# Patient Record
Sex: Male | Born: 1960
Health system: Southern US, Community
[De-identification: ages and names within clinical notes are randomized; demographics above are authoritative.]

## PROBLEM LIST (undated history)

## (undated) DIAGNOSIS — E785 Hyperlipidemia, unspecified: Secondary | ICD-10-CM

## (undated) DIAGNOSIS — K219 Gastro-esophageal reflux disease without esophagitis: Secondary | ICD-10-CM

## (undated) DIAGNOSIS — T7840XA Allergy, unspecified, initial encounter: Secondary | ICD-10-CM

## (undated) DIAGNOSIS — I10 Essential (primary) hypertension: Secondary | ICD-10-CM

## (undated) DIAGNOSIS — M109 Gout, unspecified: Secondary | ICD-10-CM

## (undated) HISTORY — DX: Hyperlipidemia, unspecified: E78.5

## (undated) HISTORY — DX: Gout, unspecified: M10.9

## (undated) HISTORY — DX: Gastro-esophageal reflux disease without esophagitis: K21.9

## (undated) HISTORY — DX: Essential (primary) hypertension: I10

## (undated) HISTORY — DX: Allergy, unspecified, initial encounter: T78.40XA

---

## 2002-02-14 ENCOUNTER — Emergency Department (HOSPITAL_COMMUNITY): Admission: EM | Admit: 2002-02-14 | Discharge: 2002-02-14 | Payer: Self-pay | Admitting: Emergency Medicine

## 2002-02-14 ENCOUNTER — Encounter: Payer: Self-pay | Admitting: Emergency Medicine

## 2004-11-11 ENCOUNTER — Encounter: Admission: RE | Admit: 2004-11-11 | Discharge: 2004-11-11 | Payer: Self-pay | Admitting: Internal Medicine

## 2004-11-14 ENCOUNTER — Encounter: Admission: RE | Admit: 2004-11-14 | Discharge: 2004-11-14 | Payer: Self-pay | Admitting: Internal Medicine

## 2007-02-09 ENCOUNTER — Ambulatory Visit (HOSPITAL_COMMUNITY): Admission: RE | Admit: 2007-02-09 | Discharge: 2007-02-09 | Payer: Self-pay | Admitting: Family Medicine

## 2008-01-04 ENCOUNTER — Emergency Department (HOSPITAL_COMMUNITY): Admission: EM | Admit: 2008-01-04 | Discharge: 2008-01-05 | Payer: Self-pay | Admitting: Emergency Medicine

## 2009-11-03 ENCOUNTER — Emergency Department (HOSPITAL_BASED_OUTPATIENT_CLINIC_OR_DEPARTMENT_OTHER): Admission: EM | Admit: 2009-11-03 | Discharge: 2009-11-03 | Payer: Self-pay | Admitting: Emergency Medicine

## 2010-08-12 LAB — COMPREHENSIVE METABOLIC PANEL
ALT: 42 U/L (ref 0–53)
AST: 28 U/L (ref 0–37)
Albumin: 4 g/dL (ref 3.5–5.2)
Alkaline Phosphatase: 39 U/L (ref 39–117)
BUN: 27 mg/dL — ABNORMAL HIGH (ref 6–23)
CO2: 29 mEq/L (ref 19–32)
Calcium: 8.4 mg/dL (ref 8.4–10.5)
Chloride: 98 mEq/L (ref 96–112)
Creatinine, Ser: 1.1 mg/dL (ref 0.4–1.5)
GFR calc Af Amer: >60 mL/min (ref >60)
GFR calc non Af Amer: >60 mL/min (ref >60)
Glucose, Bld: 108 mg/dL — ABNORMAL HIGH (ref 70–99)
Potassium: 4.4 mEq/L (ref 3.5–5.1)
Sodium: 140 mEq/L (ref 135–145)
Total Bilirubin: 1 mg/dL (ref 0.3–1.2)
Total Protein: 7.3 g/dL (ref 6.0–8.3)

## 2010-08-12 LAB — URINALYSIS, ROUTINE W REFLEX MICROSCOPIC
Bilirubin Urine: NEGATIVE
Glucose, UA: NEGATIVE mg/dL
Hgb urine dipstick: NEGATIVE
Ketones, ur: NEGATIVE mg/dL
Nitrite: NEGATIVE
Protein, ur: NEGATIVE mg/dL
Specific Gravity, Urine: 1.011 (ref 1.005–1.030)
Urobilinogen, UA: 0.2 mg/dL (ref 0.0–1.0)
pH: 6 (ref 5.0–8.0)

## 2010-08-12 LAB — DIFFERENTIAL
Basophils Absolute: 0 10*3/uL (ref 0.0–0.1)
Basophils Relative: 0 % (ref 0–1)
Eosinophils Absolute: 0 10*3/uL (ref 0.0–0.7)
Eosinophils Relative: 0 % (ref 0–5)
Lymphocytes Relative: 7 % — ABNORMAL LOW (ref 12–46)
Lymphs Abs: 0.5 10*3/uL — ABNORMAL LOW (ref 0.7–4.0)
Monocytes Absolute: 0.7 10*3/uL (ref 0.1–1.0)
Monocytes Relative: 10 % (ref 3–12)
Neutro Abs: 5.6 10*3/uL (ref 1.7–7.7)
Neutrophils Relative %: 83 % — ABNORMAL HIGH (ref 43–77)

## 2010-08-12 LAB — CBC
HCT: 42.3 % (ref 39.0–52.0)
Hemoglobin: 14.5 g/dL (ref 13.0–17.0)
MCHC: 34.2 g/dL (ref 30.0–36.0)
MCV: 87.8 fL (ref 78.0–100.0)
Platelets: 193 10*3/uL (ref 150–400)
RBC: 4.82 MIL/uL (ref 4.22–5.81)
RDW: 11.8 % (ref 11.5–15.5)
WBC: 6.8 10*3/uL (ref 4.0–10.5)

## 2011-02-21 LAB — URINALYSIS, ROUTINE W REFLEX MICROSCOPIC
Bilirubin Urine: NEGATIVE
Ketones, ur: NEGATIVE
Nitrite: NEGATIVE
Protein, ur: NEGATIVE

## 2011-02-21 LAB — COMPREHENSIVE METABOLIC PANEL
Albumin: 4.3
Alkaline Phosphatase: 32 — ABNORMAL LOW
BUN: 16
Calcium: 11.2 — ABNORMAL HIGH
Glucose, Bld: 100 — ABNORMAL HIGH
Potassium: 3.7
Sodium: 140
Total Protein: 6.9

## 2011-02-21 LAB — DIFFERENTIAL
Basophils Relative: 0
Lymphs Abs: 1.6
Monocytes Absolute: 0.6
Monocytes Relative: 7
Neutro Abs: 5.5
Neutrophils Relative %: 71

## 2011-02-21 LAB — CBC
HCT: 43.1
MCHC: 34.5
Platelets: 243
RDW: 13

## 2011-12-16 ENCOUNTER — Ambulatory Visit: Payer: Self-pay | Admitting: Internal Medicine

## 2011-12-16 ENCOUNTER — Ambulatory Visit: Payer: Self-pay

## 2011-12-16 VITALS — BP 150/92 | HR 88 | Temp 98.2°F | Resp 18 | Ht 66.75 in | Wt 194.4 lb

## 2011-12-16 DIAGNOSIS — I10 Essential (primary) hypertension: Secondary | ICD-10-CM

## 2011-12-16 DIAGNOSIS — M109 Gout, unspecified: Secondary | ICD-10-CM

## 2011-12-16 DIAGNOSIS — M79673 Pain in unspecified foot: Secondary | ICD-10-CM

## 2011-12-16 DIAGNOSIS — M79609 Pain in unspecified limb: Secondary | ICD-10-CM

## 2011-12-16 LAB — POCT CBC
Lymph, poc: 2 (ref 0.6–3.4)
MCH, POC: 29.8 pg (ref 27–31.2)
MCHC: 32.4 g/dL (ref 31.8–35.4)
MID (cbc): 0.6 (ref 0–0.9)
MPV: 8.4 fL (ref 0–99.8)
POC Granulocyte: 4.9 (ref 2–6.9)
POC LYMPH PERCENT: 26.3 %L (ref 10–50)
POC MID %: 8.4 %M (ref 0–12)
Platelet Count, POC: 357 10*3/uL (ref 142–424)
RDW, POC: 13.1 %
WBC: 7.5 10*3/uL (ref 4.6–10.2)

## 2011-12-16 LAB — URIC ACID: Uric Acid, Serum: 5.7 mg/dL (ref 4.0–7.8)

## 2011-12-16 MED ORDER — COLCHICINE 0.6 MG PO TABS: 0.6000 mg | ORAL_TABLET | ORAL | Status: DC | PRN

## 2011-12-16 MED ORDER — PREDNISONE 10 MG PO TABS: ORAL_TABLET | ORAL | Status: DC

## 2011-12-16 MED ORDER — TRAMADOL HCL 50 MG PO TABS: 50.0000 mg | ORAL_TABLET | Freq: Three times a day (TID) | ORAL | Status: AC | PRN

## 2011-12-16 NOTE — Progress Notes (Signed)
  Subjective:    Patient ID: Aaron Cantu, male    DOB: Oct 19, 1960, 51 y.o.   MRN: [id number]  HPI Has gout, went 2 yrs no attack, has now had left foot attack for 1-2 mos. Now right foot is starting to hurt. He stopped allopurinol 1-2 yrs ago. No fever Nsaids ar hurting gut and flanks.  Review of Systems     Objective:   Physical Exam Left foot swollen, very tender at mtpj, warm   130/92 Right foot not swollen but tender mtpj  Cbc,ua [facility] reading (PRIMARY) by  Dr.Guest.puched out lesions of gout. Results for orders placed in visit on 12/16/11  POCT CBC      Component Value Range   WBC 7.5  4.6 - 10.2 K/uL   Lymph, poc 2.0  0.6 - 3.4   POC LYMPH PERCENT 26.3  10 - 50 %L   MID (cbc) 0.6  0 - 0.9   POC MID % 8.4  0 - 12 %M   POC Granulocyte 4.9  2 - 6.9   Granulocyte percent 65.3  37 - 80 %G   RBC 5.34  4.69 - 6.13 M/uL   Hemoglobin 15.9  14.1 - 18.1 g/dL   HCT, POC 40.9  81.1 - 53.7 %   MCV 92.0  80 - 97 fL   MCH, POC 29.8  27 - 31.2 pg   MCHC 32.4  31.8 - 35.4 g/dL   RDW, POC 91.4     Platelet Count, POC 357  142 - 424 K/uL   MPV 8.4  0 - 99.8 fL             Assessment & Plan:  Colcrys

## 2011-12-16 NOTE — Progress Notes (Signed)
51 year old White male is here today with complaints of Left foot pain for nine to ten days. Pt has no other complaints.

## 2011-12-17 ENCOUNTER — Telehealth: Payer: Self-pay

## 2011-12-17 NOTE — Telephone Encounter (Signed)
Please look over pt rx for pretizone his instructions say pt should have more pills, they were only given 21 [phone]

## 2011-12-18 NOTE — Telephone Encounter (Signed)
Patient should have been prescribed 39 instead of 21. Labs need sign off.

## 2011-12-18 NOTE — Telephone Encounter (Signed)
Pt is out of predisone should have been given more than received [facility] [address]  Also wants the uric acids test results  Call [phone]

## 2011-12-19 ENCOUNTER — Telehealth: Payer: Self-pay

## 2011-12-19 DIAGNOSIS — M79673 Pain in unspecified foot: Secondary | ICD-10-CM

## 2011-12-19 DIAGNOSIS — I10 Essential (primary) hypertension: Secondary | ICD-10-CM

## 2011-12-19 DIAGNOSIS — M109 Gout, unspecified: Secondary | ICD-10-CM

## 2011-12-19 NOTE — Telephone Encounter (Signed)
1- please call [facility] I am pretty sure that I faxed an authorization for the correct number of pills yesterday.  If not please correct the number from #21 to #39. 2- Uric acid was NL - but that can sometimes happen during an acute attack.

## 2011-12-19 NOTE — Telephone Encounter (Signed)
[name] STATES HER HUSBAND WAS ONLY GIVEN 21 PREDNISONE PILLS AND IT SHOULD HAVE BEEN 39 SINCE HIS DOSAGE IS HIGHER. PLEASE CALL [phone]  [facility] [address]

## 2011-12-20 MED ORDER — PREDNISONE 10 MG PO TABS: ORAL_TABLET | ORAL | Status: DC

## 2011-12-20 NOTE — Telephone Encounter (Signed)
Rx sent to pharmacy   

## 2011-12-21 NOTE — Telephone Encounter (Signed)
LMOM THAT RX SHOULD HAVE BEEN SENT IN

## 2012-07-09 ENCOUNTER — Telehealth: Payer: Self-pay

## 2012-07-10 NOTE — Telephone Encounter (Signed)
She must ask her husband to come in for a PE and we can address the memory concerns then

## 2012-07-12 NOTE — Telephone Encounter (Signed)
Wife has concerns about patients memory, I have called her back to advise he needs a physical. Left message for her to call me back.

## 2012-07-12 NOTE — Telephone Encounter (Signed)
I have spoken to her to advise. She is concerned he will not make appt.

## 2012-08-02 ENCOUNTER — Telehealth: Payer: Self-pay

## 2012-08-02 MED ORDER — LISINOPRIL 10 MG PO TABS: 10.0000 mg | ORAL_TABLET | Freq: Every day | ORAL | Status: DC

## 2012-08-02 NOTE — Telephone Encounter (Signed)
Sent in for him, called to advise.

## 2012-08-02 NOTE — Telephone Encounter (Signed)
Pt does not have insurance until April 1, he is out of lisinopril (PRINIVIL,ZESTRIL) 10 MG tablet

## 2012-08-02 NOTE — Telephone Encounter (Signed)
[facility] [address]   [phone]

## 2012-08-13 ENCOUNTER — Telehealth: Payer: Self-pay

## 2012-08-13 ENCOUNTER — Other Ambulatory Visit: Payer: Self-pay | Admitting: Internal Medicine

## 2012-08-13 NOTE — Telephone Encounter (Signed)
Colchicine sent in for him today. Called him to advise, he will need office visit for further refills.

## 2012-08-13 NOTE — Telephone Encounter (Signed)
Pt's wife is calling regarding pts gout flare up, pt is unable to come in until April because he will have insurance during this time. Best# [phone]

## 2012-09-04 ENCOUNTER — Ambulatory Visit (INDEPENDENT_AMBULATORY_CARE_PROVIDER_SITE_OTHER): Payer: BC Managed Care – PPO | Admitting: Emergency Medicine

## 2012-09-04 VITALS — BP 170/100 | HR 72 | Temp 98.8°F | Resp 16 | Ht 66.0 in | Wt 203.0 lb

## 2012-09-04 DIAGNOSIS — I10 Essential (primary) hypertension: Secondary | ICD-10-CM

## 2012-09-04 DIAGNOSIS — K219 Gastro-esophageal reflux disease without esophagitis: Secondary | ICD-10-CM

## 2012-09-04 MED ORDER — LISINOPRIL 20 MG PO TABS: 20.0000 mg | ORAL_TABLET | Freq: Every day | ORAL | Status: DC

## 2012-09-04 MED ORDER — OMEPRAZOLE 40 MG PO CPDR: 40.0000 mg | DELAYED_RELEASE_CAPSULE | Freq: Every day | ORAL | Status: DC

## 2012-09-04 NOTE — Progress Notes (Signed)
  Subjective:    Patient ID: Aaron Cantu, male    DOB: 12/26/1960, 52 y.o.   MRN: [id number]  HPI patient presents for refill of his blood pressure medications. He's been out of his blood pressure medicines for 3 days now. He has not been exercising he has been gaining weight. He has a 53-year-old cleansed at home and these have been taking up a good deal of his time. He has not had a physical exam in a number of years    Review of Systems     Objective:   Physical Exam HEENT exam is unremarkable. Neck is supple. Chest is clear to auscultation and percussion. Heart regular rate no murmurs. Extremities are without edema .  EKG normal sinus rhythm      Assessment & Plan:  I told the patient he needs to schedule complete physical. He is nonfasting today so no blood work was done. I did go ahead with an EKG today. I increased his lisinopril to 20 mg a day. He will continue Prilosec at 40 mg daily as needed .

## 2012-09-04 NOTE — Patient Instructions (Addendum)

## 2012-09-18 ENCOUNTER — Telehealth: Payer: Self-pay

## 2012-11-10 ENCOUNTER — Ambulatory Visit (INDEPENDENT_AMBULATORY_CARE_PROVIDER_SITE_OTHER): Payer: BC Managed Care – PPO | Admitting: Emergency Medicine

## 2012-11-10 VITALS — BP 120/82 | HR 68 | Temp 97.3°F | Resp 18 | Ht 67.0 in | Wt 202.0 lb

## 2012-11-10 DIAGNOSIS — R079 Chest pain, unspecified: Secondary | ICD-10-CM

## 2012-11-10 DIAGNOSIS — S239XXA Sprain of unspecified parts of thorax, initial encounter: Secondary | ICD-10-CM

## 2012-11-10 MED ORDER — NAPROXEN SODIUM 550 MG PO TABS: 550.0000 mg | ORAL_TABLET | Freq: Two times a day (BID) | ORAL | Status: DC

## 2012-11-10 MED ORDER — CYCLOBENZAPRINE HCL 10 MG PO TABS: 10.0000 mg | ORAL_TABLET | Freq: Three times a day (TID) | ORAL | Status: DC | PRN

## 2012-11-10 NOTE — Progress Notes (Signed)
[facility] [address] [place] [address] [phone]  Date:  11/10/2012   Name:  Aaron Cantu   DOB:  [birth year]   MRN:  [id number]  PCP:  No primary provider on file.    Chief Complaint: Back Pain   History of Present Illness:  Aaron Cantu is a 52 y.o. very pleasant male patient who presents with the following:  Bent over to make the bed and developed a pain in the back between the shoulder blades that has persisted throughout the day.  Also had transient pain in the left anterior chest that was sharp in nature and mostly resolved after a minute. Now has some residual discomfort.  No shortness of breath, wheezing, cough.  Has nausea and no vomiting.  Associated with diarrhea 4-5 times today.  Pain in chest and back increase with deep breathing and movement.  Was doing the breast stroke in the pool with his daughter on his back yesterday.  No improvement with over the counter medications or other home remedies. Denies other complaint or health concern today.   Patient Active Problem List   Diagnosis Date Noted  . Foot pain 12/16/2011  . Gout 12/16/2011  . HTN (hypertension) 12/16/2011    Past Medical History  Diagnosis Date  . Hypertension   . Gout   . GERD (gastroesophageal reflux disease)   . Allergy     History reviewed. No pertinent past surgical history.  History  Substance Use Topics  . Smoking status: Never Smoker   . Smokeless tobacco: Not on file  . Alcohol Use: Yes     Comment: socially    Family History  Problem Relation Age of Onset  . Clotting disorder Mother   . Multiple sclerosis Father   . Brain cancer Brother     Allergies  Allergen Reactions  . Darvon (Propoxyphene Hcl) Nausea Only    Medication list has been reviewed and updated.  Current Outpatient Prescriptions on File Prior to Visit  Medication Sig Dispense Refill  . COLCRYS 0.6 MG tablet TAKE ONE TABLET BY MOUTH AS NEEDED  30 tablet  0  . lisinopril  (PRINIVIL,ZESTRIL) 20 MG tablet Take 1 tablet (20 mg total) by mouth daily.  30 tablet  5  . omeprazole (PRILOSEC) 40 MG capsule Take 1 capsule (40 mg total) by mouth daily.  30 capsule  5   No current facility-administered medications on file prior to visit.    Review of Systems:  As per HPI, otherwise negative.    Physical Examination: Filed Vitals:   11/10/12 1505  BP: 120/82  Pulse: 68  Temp: 97.3 F (36.3 C)  Resp: 18   Filed Vitals:   11/10/12 1505  Height: 5\' 7"  (1.702 m)  Weight: 202 lb (91.627 kg)   Body mass index is 31.63 kg/(m^2). Ideal Body Weight: Weight in (lb) to have BMI = 25: 159.3  GEN: WDWN, NAD, Non-toxic, A & O x 3 HEENT: Atraumatic, Normocephalic. Neck supple. No masses, No LAD. Ears and Nose: No external deformity. CV: RRR, No M/G/R. No JVD. No thrill. No extra heart sounds. PULM: CTA B, no wheezes, crackles, rhonchi. No retractions. No resp. distress. No accessory muscle use. ABD: S, NT, ND, +BS. No rebound. No HSM. EXTR: No c/c/e NEURO Normal gait.  PSYCH: Normally interactive. Conversant. Not depressed or anxious appearing.  Calm demeanor.  BACK:  Tender mid right chest wall posteriorly  Assessment and Plan: Muscle strain Anaprox Flexeril   Signed,  Ellison Carwin, MD

## 2012-11-10 NOTE — Patient Instructions (Addendum)
Back Pain, Adult  Low back pain is very common. About 1 in 5 people have back pain. The cause of low back pain is rarely dangerous. The pain often gets better over time. About half of people with a sudden onset of back pain feel better in just 2 weeks. About 8 in 10 people feel better by 6 weeks.   CAUSES  Some common causes of back pain include:  · Strain of the muscles or ligaments supporting the spine.  · Wear and tear (degeneration) of the spinal discs.  · Arthritis.  · Direct injury to the back.  DIAGNOSIS  Most of the time, the direct cause of low back pain is not known. However, back pain can be treated effectively even when the exact cause of the pain is unknown. Answering your caregiver's questions about your overall health and symptoms is one of the most accurate ways to make sure the cause of your pain is not dangerous. If your caregiver needs more information, he or she may order lab work or imaging tests (X-rays or MRIs). However, even if imaging tests show changes in your back, this usually does not require surgery.  HOME CARE INSTRUCTIONS  For many people, back pain returns. Since low back pain is rarely dangerous, it is often a condition that people can learn to manage on their own.   · Remain active. It is stressful on the back to sit or stand in one place. Do not sit, drive, or stand in one place for more than 30 minutes at a time. Take short walks on level surfaces as soon as pain allows. Try to increase the length of time you walk each day.  · Do not stay in bed. Resting more than 1 or 2 days can delay your recovery.  · Do not avoid exercise or work. Your body is made to move. It is not dangerous to be active, even though your back may hurt. Your back will likely heal faster if you return to being active before your pain is gone.  · Pay attention to your body when you  bend and lift. Many people have less discomfort when lifting if they bend their knees, keep the load close to their bodies, and  avoid twisting. Often, the most comfortable positions are those that put less stress on your recovering back.  · Find a comfortable position to sleep. Use a firm mattress and lie on your side with your knees slightly bent. If you lie on your back, put a pillow under your knees.  · Only take over-the-counter or prescription medicines as directed by your caregiver. Over-the-counter medicines to reduce pain and inflammation are often the most helpful. Your caregiver may prescribe muscle relaxant drugs. These medicines help dull your pain so you can more quickly return to your normal activities and healthy exercise.  · Put ice on the injured area.  · Put ice in a plastic bag.  · Place a towel between your skin and the bag.  · Leave the ice on for 15-20 minutes, 3-4 times a day for the first 2 to 3 days. After that, ice and heat may be alternated to reduce pain and spasms.  · Ask your caregiver about trying back exercises and gentle massage. This may be of some benefit.  · Avoid feeling anxious or stressed. Stress increases muscle tension and can worsen back pain. It is important to recognize when you are anxious or stressed and learn ways to manage it. Exercise is a great option.  SEEK MEDICAL CARE IF:  · You have pain that is not relieved with rest or   medicine.  · You have pain that does not improve in 1 week.  · You have new symptoms.  · You are generally not feeling well.  SEEK IMMEDIATE MEDICAL CARE IF:   · You have pain that radiates from your back into your legs.  · You develop new bowel or bladder control problems.  · You have unusual weakness or numbness in your arms or legs.  · You develop nausea or vomiting.  · You develop abdominal pain.  · You feel faint.  Document Released: 05/12/2005 Document Revised: 11/11/2011 Document Reviewed: 09/30/2010  [employer]® Patient Information ©2014 [employer].

## 2013-01-17 ENCOUNTER — Other Ambulatory Visit: Payer: Self-pay

## 2013-01-17 MED ORDER — OMEPRAZOLE 40 MG PO CPDR: 40.0000 mg | DELAYED_RELEASE_CAPSULE | Freq: Every day | ORAL | Status: DC

## 2013-01-31 ENCOUNTER — Other Ambulatory Visit: Payer: Self-pay

## 2013-01-31 MED ORDER — LISINOPRIL 20 MG PO TABS: 20.0000 mg | ORAL_TABLET | Freq: Every day | ORAL | Status: DC

## 2013-04-01 ENCOUNTER — Other Ambulatory Visit: Payer: Self-pay | Admitting: Emergency Medicine

## 2013-04-20 ENCOUNTER — Other Ambulatory Visit: Payer: Self-pay | Admitting: Internal Medicine

## 2013-04-28 ENCOUNTER — Telehealth: Payer: Self-pay | Admitting: Radiology

## 2013-04-28 MED ORDER — LANSOPRAZOLE 30 MG PO CPDR: 30.0000 mg | DELAYED_RELEASE_CAPSULE | Freq: Every day | ORAL | Status: DC

## 2013-04-28 NOTE — Telephone Encounter (Signed)
Called and spoke with patient. States that he hasn't been having any trouble getting omeprazole and has been taking it for a period of time now.

## 2013-04-28 NOTE — Telephone Encounter (Signed)
Sent prevacid to pharmacy.  If pt is having trouble with ins coverage, he needs to call the ins company and find out their preferred drug

## 2013-04-28 NOTE — Telephone Encounter (Signed)
Omeprazole not preferred drug with [employer] appears prevacid may be on formulary

## 2013-04-29 ENCOUNTER — Telehealth: Payer: Self-pay

## 2013-04-29 NOTE — Telephone Encounter (Signed)
Patient is going to call his insurance company to find out why they are no longer covering Prilosec. Pt will call us back if he needs any further assistance/

## 2013-04-29 NOTE — Telephone Encounter (Signed)
Patient is calling to say that he went to pick up the rx that we sent to the pharmacy and it was the wrong thing and twice the cost please call patient with questions

## 2013-05-06 ENCOUNTER — Other Ambulatory Visit: Payer: Self-pay | Admitting: Emergency Medicine

## 2013-05-06 NOTE — Telephone Encounter (Signed)
Spoke to pt. Sent in a 30 day supply. Advised him Dr. Cleta Alberts will be in the office Wed 05/11/13 Pt states he will RTC the morning.

## 2013-05-11 ENCOUNTER — Ambulatory Visit: Payer: BC Managed Care – PPO

## 2013-05-11 ENCOUNTER — Ambulatory Visit (INDEPENDENT_AMBULATORY_CARE_PROVIDER_SITE_OTHER): Payer: BC Managed Care – PPO | Admitting: Family Medicine

## 2013-05-11 VITALS — BP 122/74 | HR 74 | Temp 97.8°F | Resp 18 | Ht 66.5 in | Wt 199.0 lb

## 2013-05-11 DIAGNOSIS — I10 Essential (primary) hypertension: Secondary | ICD-10-CM

## 2013-05-11 DIAGNOSIS — Z23 Encounter for immunization: Secondary | ICD-10-CM

## 2013-05-11 DIAGNOSIS — M542 Cervicalgia: Secondary | ICD-10-CM

## 2013-05-11 DIAGNOSIS — Z0000 Encounter for general adult medical examination without abnormal findings: Secondary | ICD-10-CM

## 2013-05-11 LAB — POCT CBC
HCT, POC: 46.7 % (ref 43.5–53.7)
Hemoglobin: 15.1 g/dL (ref 14.1–18.1)
MCH, POC: 29.7 pg (ref 27–31.2)
MCV: 91.9 fL (ref 80–97)
MPV: 8.6 fL (ref 0–99.8)
POC Granulocyte: 3.1 (ref 2–6.9)
POC MID %: 6.2 %M (ref 0–12)
RBC: 5.08 M/uL (ref 4.69–6.13)
RDW, POC: 13.7 %
WBC: 5 10*3/uL (ref 4.6–10.2)

## 2013-05-11 LAB — COMPREHENSIVE METABOLIC PANEL
ALT: 29 U/L (ref 0–53)
Alkaline Phosphatase: 45 U/L (ref 39–117)
CO2: 28 mEq/L (ref 19–32)
Calcium: 9.8 mg/dL (ref 8.4–10.5)
Chloride: 101 mEq/L (ref 96–112)
Creat: 1.01 mg/dL (ref 0.50–1.35)
Glucose, Bld: 97 mg/dL (ref 70–99)
Total Bilirubin: 0.6 mg/dL (ref 0.3–1.2)

## 2013-05-11 LAB — LIPID PANEL
Cholesterol: 267 mg/dL — ABNORMAL HIGH (ref 0–200)
Total CHOL/HDL Ratio: 4.9 Ratio
VLDL: 32 mg/dL (ref 0–40)

## 2013-05-11 LAB — PSA: PSA: 1.51 ng/mL (ref <=4.00)

## 2013-05-11 MED ORDER — CYCLOBENZAPRINE HCL 10 MG PO TABS: 10.0000 mg | ORAL_TABLET | Freq: Three times a day (TID) | ORAL | Status: DC | PRN

## 2013-05-11 MED ORDER — LISINOPRIL 20 MG PO TABS: ORAL_TABLET | ORAL | Status: DC

## 2013-05-11 NOTE — Patient Instructions (Signed)
Try taking flexeril at night for your neck and try ibuprofen during the day.    I will be in touch regarding your labs Please schedule a screening colonoscopy at your convenience with Caryn Section or [facility]

## 2013-05-11 NOTE — Progress Notes (Signed)
[facility] [address], [place] [address] [phone]  Date:  05/11/2013   Name:  Aaron Cantu   DOB:  Feb 12, 1961   MRN:  [id number]  PCP:  No primary provider on file.    Chief Complaint: Annual Exam, Neck Pain and Leg Pain   History of Present Illness:  Aaron Cantu is a 52 y.o. very pleasant male patient who presents with the following:  He needs a CPE.  He also has a few other concerns today.   He has twins- they are nearly 43 years old. His daughter jumped on his back when he was prone on the ground.  It "knocked the wind out of me."  This occurred one week ago.   Since then he has noted some pain in his neck if he flexed his neck for a longer period such as if talking on the phone.  No numbness or pain down his arms.   He has not tried his flexeril as it makes him sleepy.  Ibuprofen does help.  He also notes that he strained his left groin when he moved a piece of furniture about one year ago.  Since then he may notice some soreness in this area in the morning on some days.  This is better after he has a BM.  He has not noted a bulge.  This does not bother him a lot, but he wanted to have it looked at.    He is fasting today. Married, non- smoker.  Admits he does not get as much exercise as he should.   He has not yet had a colonoscopy Unsure date of last tetanus He has a history of gout and uses colcrys PRN- however he has not had pain in about one year.    He is interested in getting a shingles vaccine.   Patient Active Problem List   Diagnosis Date Noted  . Foot pain 12/16/2011  . Gout 12/16/2011  . HTN (hypertension) 12/16/2011    Past Medical History  Diagnosis Date  . Hypertension   . Gout   . GERD (gastroesophageal reflux disease)   . Allergy     History reviewed. No pertinent past surgical history.  History  Substance Use Topics  . Smoking status: Never Smoker   . Smokeless tobacco: Not on file  . Alcohol Use: Yes   Comment: socially    Family History  Problem Relation Age of Onset  . Clotting disorder Mother   . Hypertension Mother   . Multiple sclerosis Father   . Brain cancer Brother     Allergies  Allergen Reactions  . Darvon [Propoxyphene Hcl] Nausea Only    Medication list has been reviewed and updated.  Current Outpatient Prescriptions on File Prior to Visit  Medication Sig Dispense Refill  . colchicine (COLCRYS) 0.6 MG tablet Take 1 tablet (0.6 mg total) by mouth as needed. PATIENT NEEDS OFFICE VISIT FOR ADDITIONAL REFILLS - 2nd NOTICE  15 tablet  0  . lisinopril (PRINIVIL,ZESTRIL) 20 MG tablet TAKE ONE TABLET BY MOUTH ONCE DAILY.  30 tablet  0  . cyclobenzaprine (FLEXERIL) 10 MG tablet Take 1 tablet (10 mg total) by mouth 3 (three) times daily as needed for muscle spasms.  30 tablet  0  . lansoprazole (PREVACID) 30 MG capsule Take 1 capsule (30 mg total) by mouth daily at 12 noon.  30 capsule  5  . naproxen sodium (ANAPROX DS) 550 MG tablet Take 1 tablet (550 mg  total) by mouth 2 (two) times daily with a meal.  40 tablet  0   No current facility-administered medications on file prior to visit.    Review of Systems:  As per HPI- otherwise negative.   Physical Examination: Filed Vitals:   05/11/13 0913  BP: 122/74  Pulse: 74  Temp: 97.8 F (36.6 C)  Resp: 18   Filed Vitals:   05/11/13 0913  Height: 5' 6.5" (1.689 m)  Weight: 199 lb (90.266 kg)   Body mass index is 31.64 kg/(m^2). Ideal Body Weight: Weight in (lb) to have BMI = 25: 156.9  GEN: WDWN, NAD, Non-toxic, A & O x 3, overweight, looks well HEENT: Atraumatic, Normocephalic. Neck supple. No masses, No LAD.  Bilateral TM wnl, oropharynx normal.  PEERL,EOMI.   Ears and Nose: No external deformity. CV: RRR, No M/G/R. No JVD. No thrill. No extra heart sounds. PULM: CTA B, no wheezes, crackles, rhonchi. No retractions. No resp. distress. No accessory muscle use. ABD: S, NT, ND, +BS. No rebound. No HSM. EXTR: No  c/c/e NEURO Normal gait.  PSYCH: Normally interactive. Conversant. Not depressed or anxious appearing.  Calm demeanor.  GU: no inguinal hernia appreciated.  No testicular masses.   Normal DTR, prostate is normal Normal lumbar flexion and extension, negative SLR bilaterally.  Normal strength, sensation and DTR all extremities.  Neck with normal ROM, no point bony tenderness.   [facility] reading (PRIMARY) by  Dr. Patsy Lager. C spine series; degenerative change, otherwise normal   CERVICAL SPINE 4+ VIEWS  COMPARISON: None.  FINDINGS: There is no fracture or subluxation. No foraminal stenosis. There are osteophytes at the anterior aspects of the C4-5 and C5-6 disc spaces. Slight narrowing of the C5-6 disc space.  Slight facet arthritis at C5-6 on the left C6-7 and C7-T1 on the right.  IMPRESSION: Slight to facet arthritis. Minimal degenerative disc disease at C5-6.   Results for orders placed in visit on 05/11/13  POCT CBC      Result Value Range   WBC 5.0  4.6 - 10.2 K/uL   Lymph, poc 1.6  0.6 - 3.4   POC LYMPH PERCENT 31.9  10 - 50 %L   MID (cbc) 0.3  0 - 0.9   POC MID % 6.2  0 - 12 %M   POC Granulocyte 3.1  2 - 6.9   Granulocyte percent 61.9  37 - 80 %G   RBC 5.08  4.69 - 6.13 M/uL   Hemoglobin 15.1  14.1 - 18.1 g/dL   HCT, POC 40.9  81.1 - 53.7 %   MCV 91.9  80 - 97 fL   MCH, POC 29.7  27 - 31.2 pg   MCHC 32.3  31.8 - 35.4 g/dL   RDW, POC 91.4     Platelet Count, POC 272  142 - 424 K/uL   MPV 8.6  0 - 99.8 fL    Assessment and Plan: Physical exam - Plan: POCT CBC, Comprehensive metabolic panel, Lipid panel, PSA, Tdap vaccine greater than or equal to 7yo IM  HTN (hypertension) - Plan: lisinopril (PRINIVIL,ZESTRIL) 20 MG tablet  Pain in neck - Plan: DG Cervical Spine Complete, cyclobenzaprine (FLEXERIL) 10 MG tablet  Labs as above for CPE, updated tdap and gave rx for shingles vaccine.  He is aware that his insurance may not pay until he turns 59 Encouraged a colonoscopy  and more exercise Suspect his neck hurts due to degenerative change, deconditioning and contusion to his back.  He will  try some flexeril at night only BP controlled, continue lisinopril Pain in groin: suspect a pulled groin and possible beginning hernia.  He admits that this does not bother him enough to even think about surgery at this time.  He will continue to watch it and let us know if anything gets worse  Signed Abbe Amsterdam, MD

## 2013-05-14 ENCOUNTER — Encounter: Payer: Self-pay | Admitting: Family Medicine

## 2013-05-14 NOTE — Telephone Encounter (Signed)
Pt wife called concerning husband memory, She want to know if her husband had an exam for his memory. Please call wife at [phone]

## 2013-05-16 NOTE — Telephone Encounter (Signed)
No this was not discussed in office visit. Called her to advise. He should come in for this, we can do mini mental status testing and refer to neurology if needed. Left message for her to advise.

## 2013-08-11 ENCOUNTER — Other Ambulatory Visit: Payer: Self-pay | Admitting: Physician Assistant

## 2013-08-11 DIAGNOSIS — M109 Gout, unspecified: Secondary | ICD-10-CM

## 2013-08-12 NOTE — Telephone Encounter (Signed)
Dr Patsy Lager[place], you saw pt in Dec, but not for gout. Can we RF this?

## 2013-11-03 ENCOUNTER — Telehealth: Payer: Self-pay

## 2013-11-03 ENCOUNTER — Ambulatory Visit (INDEPENDENT_AMBULATORY_CARE_PROVIDER_SITE_OTHER): Payer: BC Managed Care – PPO | Admitting: Family Medicine

## 2013-11-03 VITALS — BP 146/88 | HR 76 | Temp 98.3°F | Resp 18 | Ht 66.25 in | Wt 205.6 lb

## 2013-11-03 DIAGNOSIS — M25519 Pain in unspecified shoulder: Secondary | ICD-10-CM

## 2013-11-03 DIAGNOSIS — R079 Chest pain, unspecified: Secondary | ICD-10-CM

## 2013-11-03 DIAGNOSIS — M25511 Pain in right shoulder: Secondary | ICD-10-CM

## 2013-11-03 MED ORDER — CYCLOBENZAPRINE HCL 10 MG PO TABS: 10.0000 mg | ORAL_TABLET | Freq: Three times a day (TID) | ORAL | Status: DC | PRN

## 2013-11-03 MED ORDER — MELOXICAM 15 MG PO TABS: 15.0000 mg | ORAL_TABLET | Freq: Every day | ORAL | Status: DC

## 2013-11-03 NOTE — Patient Instructions (Signed)
Your EKG is normal today - I do not think your heart is causing your chest pain  I suspect that your pain is all musculoskeletal.  It is probably related to the yard work you were doing  Take the meloxicam (Mobic) once daily for the next week.  Do not take any additional ibuprofen or Aleve while taking this medicine.  You can take Tylenol if you need additional pain relief  Take the cyclobenzaprine (Flexeril) if needed - this may make you sleepy  Ice the shoulder frequently over the next 48 hours  Don't completely immobilize the shoulder, but do take it easy for the next couple of days  I expect this this will be feeling much better in a few days, though it may take a week or so for you to feel fully back to 100%  If any symptoms are worsening or you develop new symptoms, please come back in   Muscle Strain A muscle strain is an injury that occurs when a muscle is stretched beyond its normal length. Usually a small number of muscle fibers are torn when this happens. Muscle strain is rated in degrees. First-degree strains have the least amount of muscle fiber tearing and pain. Second-degree and third-degree strains have increasingly more tearing and pain.  Usually, recovery from muscle strain takes 1 2 weeks. Complete healing takes 5 6 weeks.  CAUSES  Muscle strain happens when a sudden, violent force placed on a muscle stretches it too far. This may occur with lifting, sports, or a fall.  RISK FACTORS Muscle strain is especially common in athletes.  SIGNS AND SYMPTOMS At the site of the muscle strain, there may be:  Pain.  Bruising.  Swelling.  Difficulty using the muscle due to pain or lack of normal function. DIAGNOSIS  Your health care provider will perform a physical exam and ask about your medical history. TREATMENT  Often, the best treatment for a muscle strain is resting, icing, and applying cold compresses to the injured area.  HOME CARE INSTRUCTIONS   Use the PRICE  method of treatment to promote muscle healing during the first 2 3 days after your injury. The PRICE method involves:  Protecting the muscle from being injured again.  Restricting your activity and resting the injured body part.  Icing your injury. To do this, put ice in a plastic bag. Place a towel between your skin and the bag. Then, apply the ice and leave it on from 15 20 minutes each hour. After the third day, switch to moist heat packs.  Apply compression to the injured area with a splint or elastic bandage. Be careful not to wrap it too tightly. This may interfere with blood circulation or increase swelling.  Elevate the injured body part above the level of your heart as often as you can.  Only take over-the-counter or prescription medicines for pain, discomfort, or fever as directed by your health care provider.  Warming up prior to exercise helps to prevent future muscle strains. SEEK MEDICAL CARE IF:   You have increasing pain or swelling in the injured area.  You have numbness, tingling, or a significant loss of strength in the injured area. MAKE SURE YOU:   Understand these instructions.  Will watch your condition.  Will get help right away if you are not doing well or get worse. Document Released: 05/12/2005 Document Revised: 03/02/2013 Document Reviewed: 12/09/2012 [facility] Patient Information 2014 [place], [place].

## 2013-11-03 NOTE — Telephone Encounter (Addendum)
I was unaware that his wife was asked to leave the room.  She was not in the room by the time I saw him, and he did not mention to me that he wanted her in the room.  He did not report any neurological symptoms, and he appeared neurologically intact at the time of the visit.  He was seen for shoulder/chest pain that developed after working in the yard.  His symptoms are consistent with musculoskeletal pain.  His EKG was normal.  If pt is concerned that he needs a scan of his brain, he needs to RTC so we can discuss.  He indicated to me that he was here for shoulder pain NOT a neurological concern

## 2013-11-03 NOTE — Telephone Encounter (Signed)
How would you like to deal with this? Patient was seen for chest pain, most of this problem seems social, if he is upset because she called.

## 2013-11-03 NOTE — Progress Notes (Signed)
Subjective:    Patient ID: Aaron Cantu, male    DOB: 1960/11/25, 53 y.o.   MRN: [id number]  HPI   Aaron Cantu is a very pleasant 53 yr old male here with complaint of RIGHT shoulder pain that began this morning.  He also has some associated upper chest pain that radiates to the RIGHT.  He reports that pain is worse with movement - specifically flexion and internal rotation.   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  He also has two young children who will sometimes jump on him.  He reports that about 1 week ago he had pain in the LEFT shoulder and LEFT chest but this resolved.  Occasionally he has pain in his RIGHT neck, but thinks this may be due to the way he is sleeping.  He is neck does not hurt currently.  He denies numbness or weakness.  He denies prior issues with the RIGHT shoulder.  He is RIGHT hand dominant.  He has not taken anything for pain.  The CP is in the upper middle chest but radiates to the RIGHT esp when he takes a deep breath and expands the chest.  He denies exertional chest pain. He denies SOB.    He has previously had bursitis of the LEFT shoulder - this shoulder is not bothering him today.  Has previously used Flexeril with good relief.     Review of Systems  Constitutional: Negative for fever and chills.  Respiratory: Negative for cough, shortness of breath and wheezing.   Cardiovascular: Positive for chest pain. Negative for palpitations.  Gastrointestinal: Negative.   Musculoskeletal: Positive for arthralgias and myalgias. Negative for back pain, gait problem and neck pain.  Skin: Negative.   Neurological: Negative for weakness and numbness.       Objective:   Physical Exam  Vitals reviewed. Constitutional: He is oriented to person, place, and time. He appears well-developed and well-nourished. No distress.  HENT:  Head: Normocephalic and atraumatic.  Eyes: Conjunctivae are normal. No scleral icterus.  Cardiovascular: Normal rate,  regular rhythm, normal heart sounds and intact distal pulses.   Pulmonary/Chest: Effort normal and breath sounds normal. He has no wheezes. He has no rales.  Musculoskeletal:       Right shoulder: He exhibits decreased range of motion and tenderness. He exhibits no bony tenderness, no crepitus, no deformity, normal pulse and normal strength.  Tenderness about the RIGHT shoulder, esp at anterior aspect; increased pain with resisted flexion and resisted internal rotation; full passive range; drop arm test negative; empty can test negative; radial pulse 2+; sensation intact  Neurological: He is alert and oriented to person, place, and time.  Skin: Skin is warm and dry.  Psychiatric: He has a normal mood and affect. His behavior is normal.      EKG interpretation with Dr. Alwyn Ren  - NSR     Assessment & Plan:  Right shoulder pain - Plan: cyclobenzaprine (FLEXERIL) 10 MG tablet, meloxicam (MOBIC) 15 MG tablet  Chest pain - Plan: EKG 12-Lead   Aaron Cantu is a very pleasant 53 yr old male here with onset of RIGHT shoulder pain and RIGHT sided chest pain today.  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  Suspect muscle strain/overuse injury.  His chest pain is reproducible on palpation, and I suspect it is a strain of the pectoralis muscle as it radiates across to his right shoulder.  His EKG is normal.  Will  treat with Mobic and Flexeril.  Frequent icing over the next 48 hours.  Relative rest for the RIGHT shoulder.    Pt to call or RTC if worsening or not improving  E. Frances FurbishElizabeth Dacie Mandel MHS, PA-C [facility] 11/03/2013

## 2013-11-03 NOTE — Telephone Encounter (Signed)
Wife is extremely upset 1) she was asked to leave the room this morning while he was in the exam room;  2) Something is wrong with her husband neurology and she insists he has an MRI or scan of his brain.  3) "She will hold [place] liable if something happens."  He will yell and scream at her if he knows she called [place].   Her cell phone is [phone]

## 2013-11-04 NOTE — Telephone Encounter (Signed)
LMVM to CB. 

## 2013-11-05 ENCOUNTER — Telehealth: Payer: Self-pay | Admitting: Family Medicine

## 2013-11-05 NOTE — Progress Notes (Signed)
Discussed patient.  Reviewed the xrays which appear normal.  Agree with plan.  Janace Hoardavid Angla Delahunt MD

## 2013-11-05 NOTE — Telephone Encounter (Signed)
[name] called back again.  Asked me to call Aaron Cantu and tell him that he needs to come in and be seen for his blood pressure and headaches as a way of getting him into the office for "a complete neurological exam and a scan."  Explained that I cannot lie to a patient in this way, but we are certainly glad to address her concerns with Aaron Cantu when he comes in.

## 2013-11-05 NOTE — Telephone Encounter (Signed)
Aaron Cantu's wife Aaron Cantu called today- I spoke with her.  She notes that her husband is getting lost, having a lot of trouble with his memory over the last year or so.  However he does not want to seek care and does not desire to have further evaluation.  He was here a couple of days ago with shoulder pain- Aaron Cantu saw him and was not aware that he demanded his wife leave prior to her exam.  She is upset that we were not aware of her other concerns, and she is worried and wants him to be further evaluated.  This is certainly a good idea- we are glad to see at her earliest convenience.  Offered to call ambulance or police for her, but she does not feel this is necessary.  She will plan to bring him in, but is not sure how she will convince him to be seen.  Encouraged her to enlist other family members to help and support her.

## 2014-04-18 ENCOUNTER — Other Ambulatory Visit: Payer: Self-pay | Admitting: Family Medicine

## 2014-04-25 ENCOUNTER — Telehealth: Payer: Self-pay

## 2014-04-25 NOTE — Telephone Encounter (Signed)
[name] states she is really worried about her husband, he have been sleeping a lot and not himself, she thinks he is depressed even though he's saying nothing is wrong Would like to speak with someone about him. Please call [phone]

## 2014-04-25 NOTE — Telephone Encounter (Signed)
Per last telephone message- Dr Patsy Lageropland wanted pt to RTC. LM for wife to bring pt infor an OV and these concerns would be addressed.

## 2014-07-04 ENCOUNTER — Other Ambulatory Visit: Payer: Self-pay | Admitting: Family Medicine

## 2014-07-21 ENCOUNTER — Ambulatory Visit (INDEPENDENT_AMBULATORY_CARE_PROVIDER_SITE_OTHER): Payer: 59 | Admitting: Physician Assistant

## 2014-07-21 VITALS — BP 178/96 | HR 74 | Temp 98.5°F | Resp 16 | Ht 67.0 in | Wt 203.0 lb

## 2014-07-21 DIAGNOSIS — E785 Hyperlipidemia, unspecified: Secondary | ICD-10-CM | POA: Insufficient documentation

## 2014-07-21 DIAGNOSIS — I10 Essential (primary) hypertension: Secondary | ICD-10-CM

## 2014-07-21 DIAGNOSIS — R0789 Other chest pain: Secondary | ICD-10-CM

## 2014-07-21 LAB — COMPREHENSIVE METABOLIC PANEL
ALK PHOS: 42 U/L (ref 39–117)
ALT: 23 U/L (ref 0–53)
AST: 18 U/L (ref 0–37)
Albumin: 5 g/dL (ref 3.5–5.2)
BUN: 17 mg/dL (ref 6–23)
CHLORIDE: 108 meq/L (ref 96–112)
CO2: 23 meq/L (ref 19–32)
Calcium: 9.4 mg/dL (ref 8.4–10.5)
Creat: 1.05 mg/dL (ref 0.50–1.35)
GLUCOSE: 89 mg/dL (ref 70–99)
POTASSIUM: 4.2 meq/L (ref 3.5–5.3)
SODIUM: 143 meq/L (ref 135–145)
TOTAL PROTEIN: 7.8 g/dL (ref 6.0–8.3)
Total Bilirubin: 0.3 mg/dL (ref 0.2–1.2)

## 2014-07-21 LAB — LIPID PANEL
Cholesterol: 234 mg/dL — ABNORMAL HIGH (ref 0–200)
HDL: 54 mg/dL (ref >=40)
LDL CALC: 147 mg/dL — AB (ref 0–99)
Total CHOL/HDL Ratio: 4.3 Ratio
Triglycerides: 164 mg/dL — ABNORMAL HIGH (ref <150)
VLDL: 33 mg/dL (ref 0–40)

## 2014-07-21 MED ORDER — ATORVASTATIN CALCIUM 20 MG PO TABS: 20.0000 mg | ORAL_TABLET | Freq: Every day | ORAL | Status: DC

## 2014-07-21 MED ORDER — LISINOPRIL 20 MG PO TABS: 20.0000 mg | ORAL_TABLET | Freq: Every day | ORAL | Status: DC

## 2014-07-21 NOTE — Progress Notes (Signed)
Subjective:    Patient ID: Aaron Cantu, male    DOB: [birth year], 54 y.o.   MRN: [id number]  Chief Complaint  Patient presents with  . Medication Refill    Lisinopril  . Hypertension  . Chest Pain    off and on for 3 weeks, left sided   Patient Active Problem List   Diagnosis Date Noted  . Hyperlipidemia 07/21/2014  . Foot pain 12/16/2011  . Gout 12/16/2011  . HTN (hypertension) 12/16/2011   Prior to Admission medications   Medication Sig Start Date End Date Taking? Authorizing Provider  lisinopril (PRINIVIL,ZESTRIL) 20 MG tablet Take 1 tablet (20 mg total) by mouth daily. 07/21/14  Yes Negar Sieler, PA  omeprazole (PRILOSEC) 20 MG capsule Take 20 mg by mouth daily.   Yes Historical Provider, MD  atorvastatin (LIPITOR) 20 MG tablet Take 1 tablet (20 mg total) by mouth daily. 07/21/14   Raelyn Ensign, PA  colchicine (COLCRYS) 0.6 MG tablet Take 1 tablet by mouth as needed. Patient not taking: Reported on 07/21/2014 08/12/13   Pearline Cables, MD   Medications, allergies, past medical history, surgical history, family history, social history and problem list reviewed and updated.  HPI  59 yom with pmh htn, gout presents for med refill and intermittent cp/shoulder pain.  CP/shoulder pain - Started under his left shoulder blade after reaching for something two wks ago. This resolved but then started having intermittent left sided cp/left shoulder pain. These episodes would last for several secs then disappear. They were two separate pains, they did not radiate. No assoc sob, presyncope, syncope, palps. He has young daughters who he plays with and thinks he may have strained the area playing with them. Has not had any of the pains in the past week, just mentioned them as he was already here for bp refill. Denies exertional cp. Pain not assoc with meals.   HTN - Been on lisinopril 20 qd. Checks his bp at home approx once month runs 120-130s/80-90s. Has been out of his med past few days.  Denies assoc ha, vision changes.   Lipids drawn yest were elevated. LDL 181. He states he wasn't informed of this and is not on statin. No hx diabetes. No fam hx cad.   Last cmp 14 months ago at last refill.  13.7%  Review of Systems No fever chills. See HPI.     Objective:   Physical Exam  Constitutional: He is oriented to person, place, and time. He appears well-developed and well-nourished.  Non-toxic appearance. He does not have a sickly appearance. He does not appear ill. No distress.  BP 178/96 mmHg  Pulse 74  Temp(Src) 98.5 F (36.9 C)  Resp 16  Ht  (1.702 m)  Wt 203 lb (92.08 kg)  BMI 31.79 kg/m2  SpO2 98%   Eyes: Conjunctivae and EOM are normal. Pupils are equal, round, and reactive to light.  Cardiovascular: Normal rate, regular rhythm and normal heart sounds.  Exam reveals no gallop.   No murmur heard. Pulmonary/Chest: Effort normal and breath sounds normal. No tachypnea. He has no decreased breath sounds. He has no wheezes. He has no rhonchi. He has no rales.  No TTP over chest wall.   Abdominal: Soft. Normal appearance and bowel sounds are normal. There is no tenderness.  Musculoskeletal:       Left shoulder: He exhibits tenderness. He exhibits normal range of motion, no bony tenderness, no swelling and no effusion.  Arms: Mild ttp anterior left shoulder over circled area. Full rom. No erythema, warmth. Neg neers testing. No pain with abduction.   Neurological: He is alert and oriented to person, place, and time.  Psychiatric: He has a normal mood and affect. His speech is normal and behavior is normal.      Assessment & Plan:   89 yom with pmh htn, gout presents for med refill and intermittent cp/shoulder pain.  Hyperlipidemia - Plan: Lipid panel, Comprehensive metabolic panel, atorvastatin (LIPITOR) 20 MG tablet --LDL 181 14 months ago --ACC/AHA 10 yr risk 13.7% --start atorva 10, titrate to 20 in 2 wks --rechecking lipids today --cmp  today --info on limiting saturated fats given  Left-sided chest wall pain --doubt cardiac etiology as has not had for one wk, no exertional cp, sx are located in left upper chest/left shoulder and only last for seconds, no assoc sob, presyncope, syncope, palps --sx started after reaching for something --mildly ttp left anterior shoulder --rest/ice/tylenol prn --er if worsens, does not resolve after few secs  Essential hypertension - Plan: Comprehensive metabolic panel, lisinopril (PRINIVIL,ZESTRIL) 20 MG tablet --elevated today but pt states typically normal range at home, has been out of med past few days --refilled for one year --check at home 3x/week --> inform if running >140/90 --k, bun/cr today  Donnajean Lopesodd M. Shawan Corella, PA-C Physician Assistant-Certified [facility]  07/21/2014 1:21 PM

## 2014-07-21 NOTE — Patient Instructions (Signed)
I don't think your left chest/shoulder pain is due to any issue with your heart. Please be sure to go to the ED if your symptoms don't go away or if they are worse than usual. Please be sure to go to the ED right away if you have associated shortness of breath, dizziness, or passing out.  We have refilled your bp med for the year. Please take your bp at home a few times per week and record these numbers. If you are running above 140/90 please let us know as we should add a second agent.  We will start a statin today for your cholesterol. Please start taking 1/2 tab for 2 weeks, then increase this to a whole tab. Please comeback to see us in one year for refills or sooner if you have any issues.  I will be in touch with you about the labs we drew today.   High Cholesterol High cholesterol refers to having a high level of cholesterol in your blood. Cholesterol is a white, waxy, fat-like protein that your body needs in small amounts. Your liver makes all the cholesterol you need. Excess cholesterol comes from the food you eat. Cholesterol travels in your bloodstream through your blood vessels. If you have high cholesterol, deposits (plaque) may build up on the walls of your blood vessels. This makes the arteries narrower and stiffer. Plaque increases your risk of heart attack and stroke. Work with your health care provider to keep your cholesterol levels in a healthy range. RISK FACTORS Several things can make you more likely to have high cholesterol. These include:   Eating foods high in animal fat (saturated fat) or cholesterol.  Being overweight.  Not getting enough exercise.  Having a family history of high cholesterol. SIGNS AND SYMPTOMS High cholesterol does not cause symptoms. DIAGNOSIS  Your health care provider can do a blood test to check whether you have high cholesterol. If you are older than 20, your health care provider may check your cholesterol every 4-6 years. You may be checked  more often if you already have high cholesterol or other risk factors for heart disease. The blood test for cholesterol measures the following:  Bad cholesterol (LDL cholesterol). This is the type of cholesterol that causes heart disease. This number should be less than 100.  Good cholesterol (HDL cholesterol). This type helps protect against heart disease. A healthy level of HDL cholesterol is 60 or higher.  Total cholesterol. This is the combined number of LDL cholesterol and HDL cholesterol. A healthy number is less than 200. TREATMENT  High cholesterol can be treated with diet changes, lifestyle changes, and medicine.   Diet changes may include eating more whole grains, fruits, vegetables, nuts, and fish. You may also have to cut back on red meat and foods with a lot of added sugar.  Lifestyle changes may include getting at least 40 minutes of aerobic exercise three times a week. Aerobic exercises include walking, biking, and swimming. Aerobic exercise along with a healthy diet can help you maintain a healthy weight. Lifestyle changes may also include quitting smoking.  If diet and lifestyle changes are not enough to lower your cholesterol, your health care provider may prescribe a statin medicine. This medicine has been shown to lower cholesterol and also lower the risk of heart disease. HOME CARE INSTRUCTIONS  Only take over-the-counter or prescription medicines as directed by your health care provider.   Follow a healthy diet as directed by your health care provider. For  instance:   Eat chicken (without skin), fish, veal, shellfish, ground [place] breast, and round or loin cuts of red meat.  Do not eat fried foods and fatty meats, such as hot dogs and salami.   Eat plenty of fruits, such as apples.   Eat plenty of vegetables, such as broccoli, potatoes, and carrots.   Eat beans, peas, and lentils.   Eat grains, such as barley, rice, couscous, and bulgur wheat.   Eat  pasta without cream sauces.   Use skim or nonfat milk and low-fat or nonfat yogurt and cheeses. Do not eat or drink whole milk, cream, ice cream, egg yolks, and hard cheeses.   Do not eat stick margarine or tub margarines that contain trans fats (also called partially hydrogenated oils).   Do not eat cakes, cookies, crackers, or other baked goods that contain trans fats.   Do not eat saturated tropical oils, such as coconut and palm oil.   Exercise as directed by your health care provider. Increase your activity level with activities such as gardening or walking.   Keep all follow-up appointments.  SEEK MEDICAL CARE IF:  You are struggling to maintain a healthy diet or weight.  You need help starting an exercise program.  You need help to stop smoking. SEEK IMMEDIATE MEDICAL CARE IF:  You have chest pain.  You have trouble breathing. Document Released: 05/12/2005 Document Revised: 09/26/2013 Document Reviewed: 03/04/2013 [facility] Patient Information 2015 [place], [place]. This information is not intended to replace advice given to you by your health care provider. Make sure you discuss any questions you have with your health care provider.

## 2014-07-21 NOTE — Progress Notes (Signed)
  Medical screening examination/treatment/procedure(s) were performed by non-physician practitioner and as supervising physician I was immediately available for consultation/collaboration.     

## 2014-07-24 ENCOUNTER — Encounter: Payer: Self-pay | Admitting: Physician Assistant

## 2015-05-07 ENCOUNTER — Other Ambulatory Visit: Payer: Self-pay | Admitting: Physician Assistant

## 2015-05-10 ENCOUNTER — Other Ambulatory Visit: Payer: Self-pay | Admitting: Family Medicine

## 2015-05-11 ENCOUNTER — Other Ambulatory Visit: Payer: Self-pay | Admitting: Family Medicine

## 2015-05-11 NOTE — Telephone Encounter (Signed)
Wife calling for refill on colchicine (COLCRYS) 0.6 MG tablet   Dr. Merla Richesoolittle - [phone]   Insurance does not kick in till January 1

## 2015-07-10 IMAGING — CR DG CERVICAL SPINE COMPLETE 4+V
5 series · 5 of 5 positions shown · non-contrast
Comparison: None.

CLINICAL DATA: Posterior neck pain for 1 week.

EXAM:
CERVICAL SPINE  4+ VIEWS

[lpo]
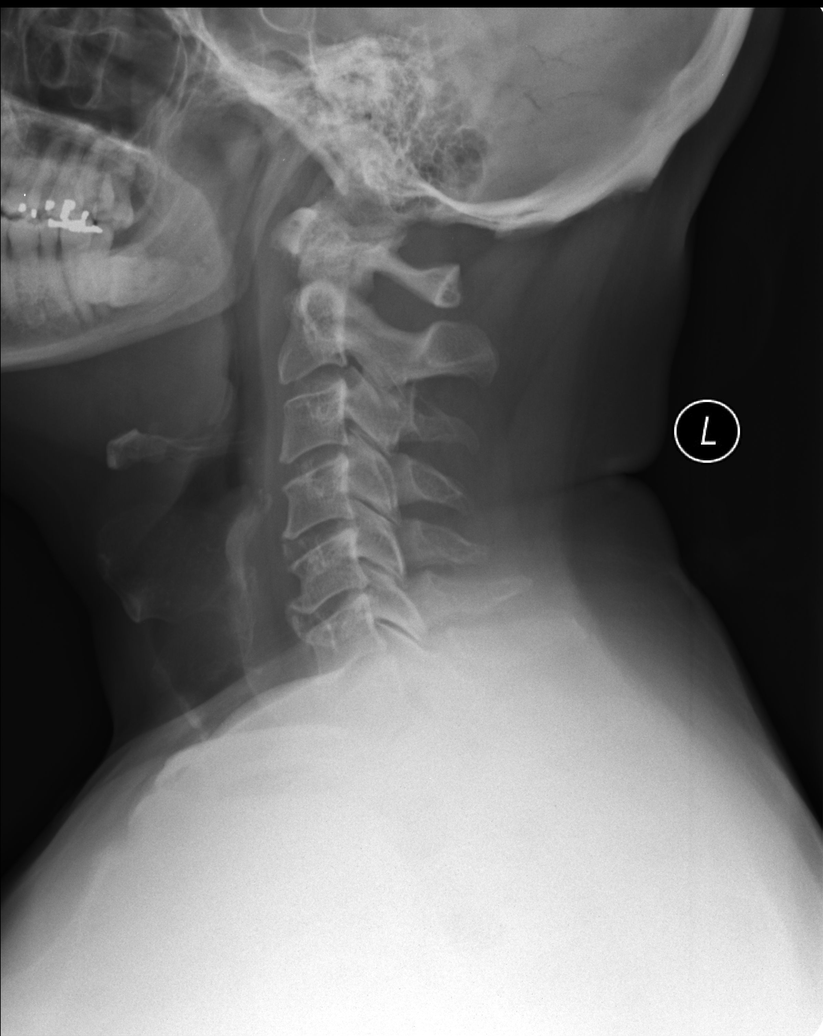

[lateral]
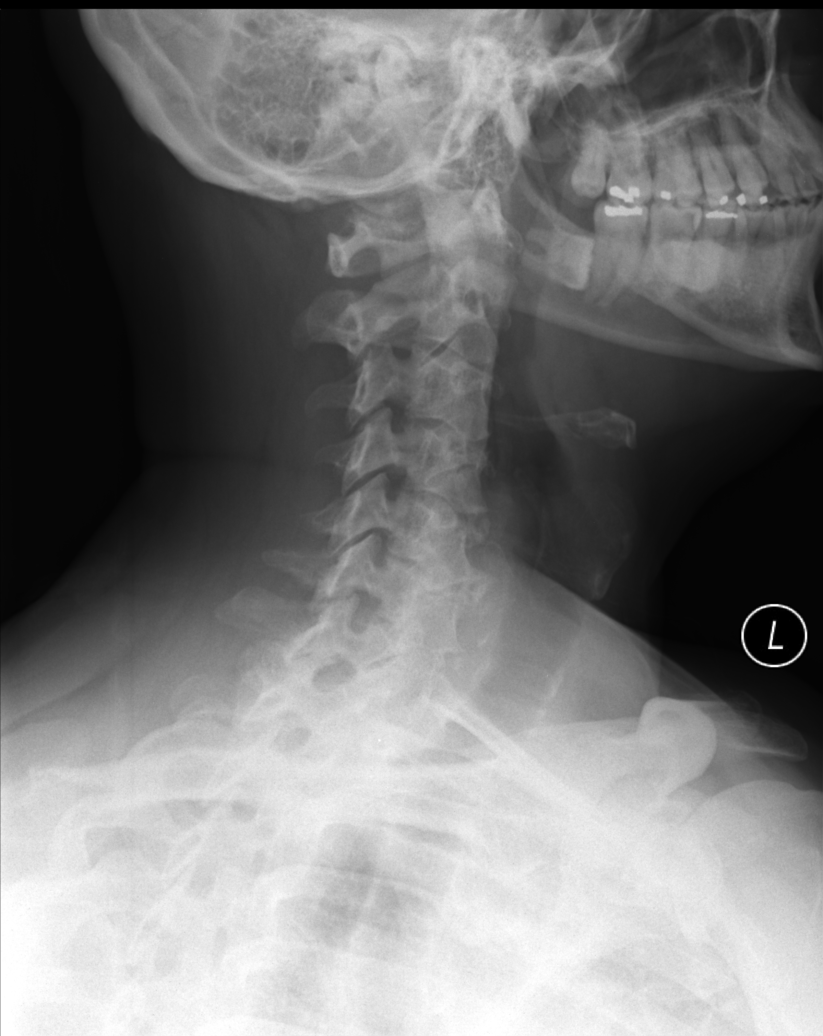

[rpo]
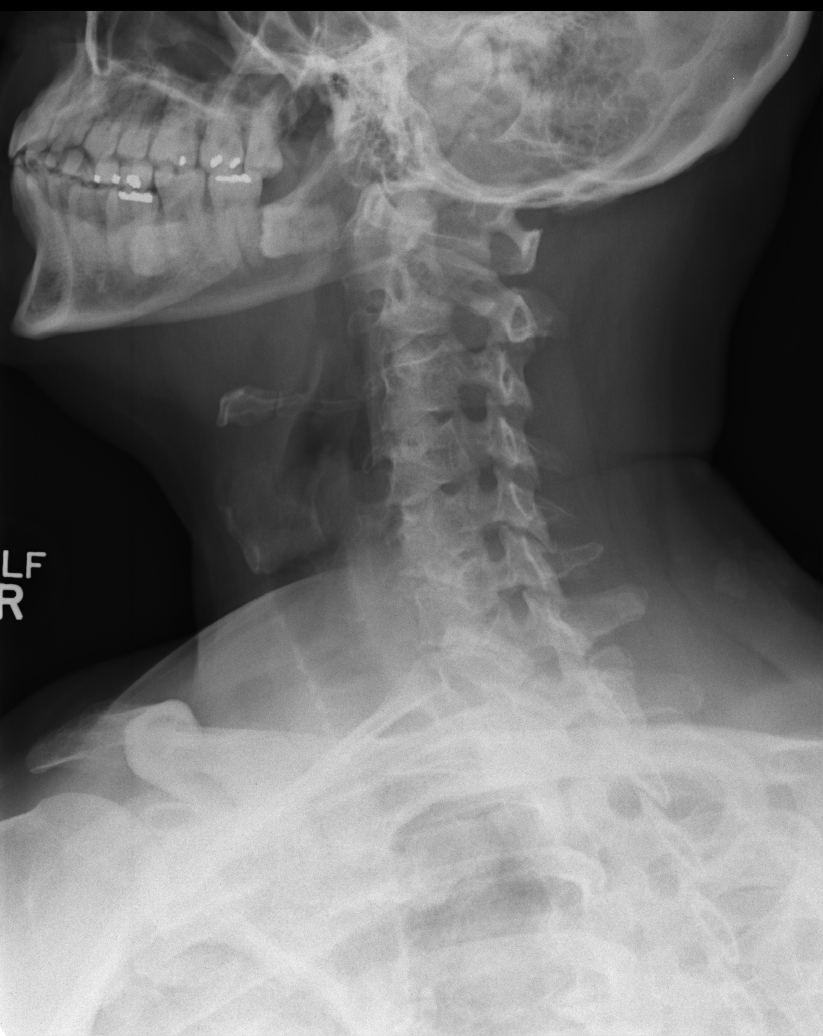

[AP]
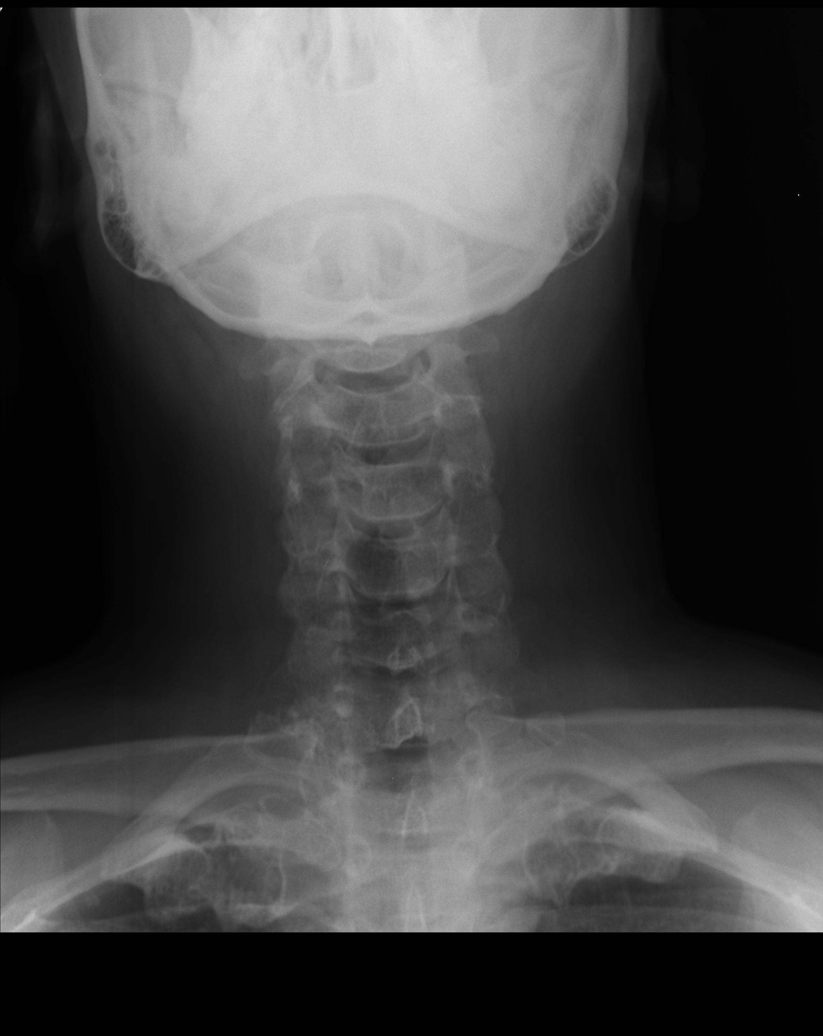

[ap open mouth]
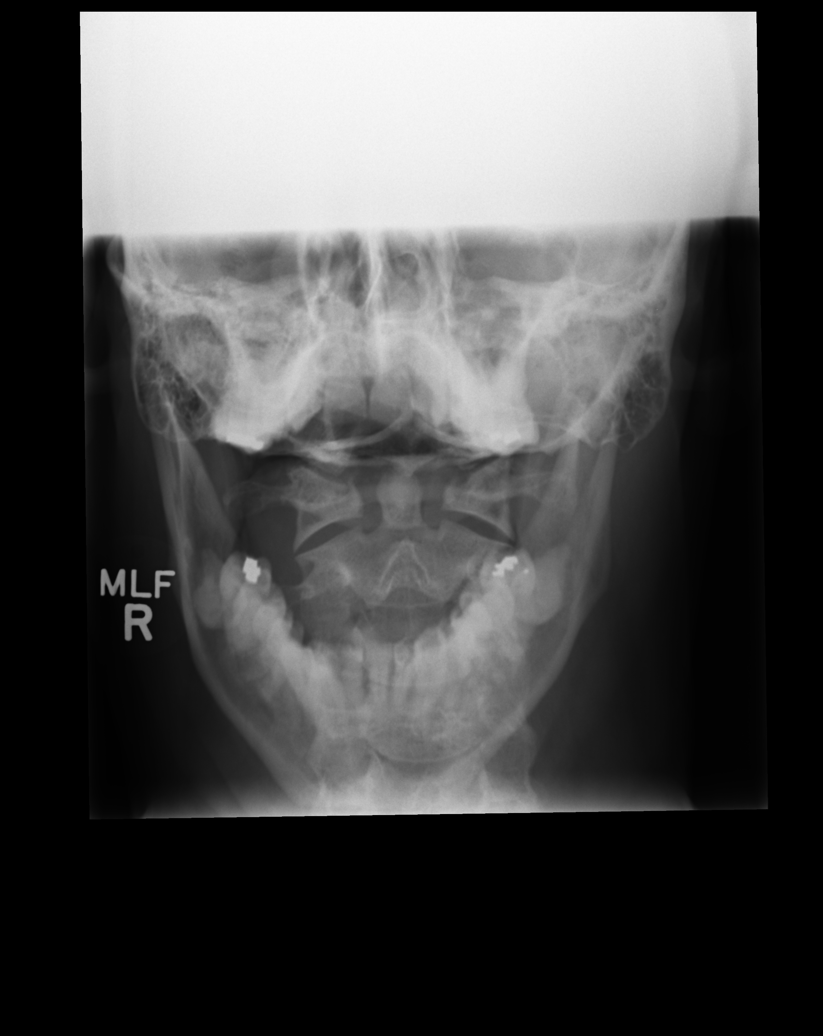

[5 of 5 positions shown; findings below may reference images not displayed]

FINDINGS: There is no fracture or subluxation. No foraminal stenosis. There
are osteophytes at the anterior aspects of the C4-5 and C5-6 disc
spaces. Slight narrowing of the C5-6 disc space.

Slight facet arthritis at C5-6 on the left C6-7 and C7-T1 on the
right.
IMPRESSION: Slight to facet arthritis. Minimal degenerative disc disease at
C5-6.

## 2015-07-31 ENCOUNTER — Ambulatory Visit (INDEPENDENT_AMBULATORY_CARE_PROVIDER_SITE_OTHER): Payer: BLUE CROSS/BLUE SHIELD | Admitting: Internal Medicine

## 2015-07-31 VITALS — BP 165/108 | HR 88 | Temp 97.8°F | Resp 18 | Ht 67.0 in | Wt 202.0 lb

## 2015-07-31 DIAGNOSIS — Z0000 Encounter for general adult medical examination without abnormal findings: Secondary | ICD-10-CM | POA: Diagnosis not present

## 2015-07-31 DIAGNOSIS — Z1211 Encounter for screening for malignant neoplasm of colon: Secondary | ICD-10-CM

## 2015-07-31 DIAGNOSIS — M25561 Pain in right knee: Secondary | ICD-10-CM | POA: Diagnosis not present

## 2015-07-31 DIAGNOSIS — M542 Cervicalgia: Secondary | ICD-10-CM

## 2015-07-31 DIAGNOSIS — Z6831 Body mass index (BMI) 31.0-31.9, adult: Secondary | ICD-10-CM

## 2015-07-31 DIAGNOSIS — M10079 Idiopathic gout, unspecified ankle and foot: Secondary | ICD-10-CM

## 2015-07-31 DIAGNOSIS — Z9189 Other specified personal risk factors, not elsewhere classified: Secondary | ICD-10-CM

## 2015-07-31 DIAGNOSIS — I10 Essential (primary) hypertension: Secondary | ICD-10-CM

## 2015-07-31 DIAGNOSIS — Z1159 Encounter for screening for other viral diseases: Secondary | ICD-10-CM | POA: Diagnosis not present

## 2015-07-31 DIAGNOSIS — E785 Hyperlipidemia, unspecified: Secondary | ICD-10-CM | POA: Diagnosis not present

## 2015-07-31 DIAGNOSIS — Z789 Other specified health status: Secondary | ICD-10-CM

## 2015-07-31 LAB — CBC WITH DIFFERENTIAL/PLATELET
BASOS ABS: 0 10*3/uL (ref 0.0–0.1)
BASOS PCT: 1 % (ref 0–1)
EOS PCT: 1 % (ref 0–5)
Eosinophils Absolute: 0 10*3/uL (ref 0.0–0.7)
HEMATOCRIT: 44.6 % (ref 39.0–52.0)
Hemoglobin: 16 g/dL (ref 13.0–17.0)
LYMPHS PCT: 36 % (ref 12–46)
Lymphs Abs: 1.8 10*3/uL (ref 0.7–4.0)
MCH: 31.1 pg (ref 26.0–34.0)
MCHC: 35.9 g/dL (ref 30.0–36.0)
MCV: 86.8 fL (ref 78.0–100.0)
MPV: 9.3 fL (ref 8.6–12.4)
Monocytes Absolute: 0.3 10*3/uL (ref 0.1–1.0)
Monocytes Relative: 7 % (ref 3–12)
Neutro Abs: 2.7 10*3/uL (ref 1.7–7.7)
Neutrophils Relative %: 55 % (ref 43–77)
PLATELETS: 232 10*3/uL (ref 150–400)
RBC: 5.14 MIL/uL (ref 4.22–5.81)
RDW: 13.1 % (ref 11.5–15.5)
WBC: 4.9 10*3/uL (ref 4.0–10.5)

## 2015-07-31 MED ORDER — AMLODIPINE BESYLATE 5 MG PO TABS: 5.0000 mg | ORAL_TABLET | Freq: Every day | ORAL | Status: DC

## 2015-07-31 MED ORDER — LISINOPRIL-HYDROCHLOROTHIAZIDE 20-12.5 MG PO TABS: 1.0000 | ORAL_TABLET | Freq: Every day | ORAL | Status: DC

## 2015-07-31 MED ORDER — LISINOPRIL 20 MG PO TABS: 20.0000 mg | ORAL_TABLET | Freq: Every day | ORAL | Status: DC

## 2015-07-31 NOTE — Progress Notes (Signed)
Subjective:  By signing my name below, I, [name], attest that this documentation has been prepared under the direction and in the presence of Tami Lin, MD. Electronically Signed: [name], [place]. 07/31/2015 , 3:48 PM .  Patient was seen in Room 10 .   Patient ID: Aaron Cantu, male    DOB: 1960-10-01, 56 y.o.   MRN: [id number] Chief Complaint  Patient presents with  . Annual Exam  . Medication Refill    lisnopril  . Other    neck problem, feels some numbness on the right side of his neck   HPI Aaron Cantu is a 55 y.o. male who presents to [facility] for annual physical.   Medications He also needs medication refill on lisinopril.  He's stopped taking lipitor.  He only takes colchicine when he feels a twinge. He took one a few days ago when he felt a sensation in his left foot. But this has resolved. Usually 1 pill of colchicine stops the progression. Note that he was on allopurinol at one point area  Neck problems He reports feeling some numbness on the right side of his neck. He notices it more when he first wakes up in the morning. Sometimes it feels sore and stiff, and now it feels more like numbness. He initially noticed it while he was mowing the grass 6 months ago, and he notes having a small hilly incline in his backyard.   Right knee He mentions using a pogo stick with his children a few weeks ago. Initially, he didn't have any pain or troubles, but when he woke up the next day, he felt pulling pain in his right knee. He moved it around and stretched it for relief. This has resolved.   Chest Pain He had some intermittent chest pain that would appear out of nowhere, initially noticed few weeks ago in the evening.- he went to sleep, and woke up without any more pain. This was not associated with shortness of breath palpitations or diaphoresis nor any Reflux. The episode was minutes in duration. It is not been repeated with any exercise.  Cancer Screening He  denies having a colonoscopy being done yet. He would like a referral to have it done.   Patient Active Problem List   Diagnosis Date Noted  . Hyperlipidemia 07/21/2014  . Foot pain 12/16/2011  . Gout 12/16/2011  . HTN (hypertension) 12/16/2011    Current outpatient prescriptions:  .  lisinopril (PRINIVIL,ZESTRIL) 20 MG tablet, Take 1 tablet (20 mg total) by mouth daily., Disp: 30 tablet, Rfl: 11 .  atorvastatin (LIPITOR) 20 MG tablet, Take 1 tablet (20 mg total) by mouth daily. (Patient not taking: Reported on 07/31/2015), Disp: 90 tablet, Rfl: 3 .  colchicine (COLCRYS) 0.6 MG tablet, 1 TABLET BY MOUTH AS NEEDED AS DIRECTED   "NO MORE REFILLS WITHOUT OFFICE VISIT" (Patient not taking: Reported on 07/31/2015), Disp: 30 tablet, Rfl: 0 .  lisinopril (PRINIVIL,ZESTRIL) 20 MG tablet, TAKE 1 TABLET BY MOUTH DAILY (Patient not taking: Reported on 07/31/2015), Disp: 90 tablet, Rfl: 0 .  omeprazole (PRILOSEC) 20 MG capsule, Take 20 mg by mouth daily. Reported on 07/31/2015, Disp: , Rfl:   Review of Systems  Constitutional: Positive for activity change. Negative for fever, chills, appetite change and fatigue.       He has become very sedentary over the last 2 years  HENT: Negative for hearing loss, trouble swallowing and voice change.        He has typical seasonal  allergic rhinitis  Eyes: Negative for photophobia and visual disturbance.  Respiratory: Negative for cough, choking, chest tightness and shortness of breath.   Cardiovascular: Negative for chest pain, palpitations and leg swelling.  Gastrointestinal: Negative for nausea, vomiting, abdominal pain, diarrhea and constipation.       Never had a colonoscopy  Genitourinary: Negative for frequency, flank pain and difficulty urinating.  Musculoskeletal: Positive for neck pain and neck stiffness. Negative for myalgias, back pain, joint swelling and arthralgias.  Skin: Negative for rash and wound.  Neurological: Negative for dizziness and headaches.    Hematological: Negative for adenopathy. Does not bruise/bleed easily.  Psychiatric/Behavioral: Negative for sleep disturbance, dysphoric mood and decreased concentration.      Objective:   Physical Exam  Constitutional: He is oriented to person, place, and time. He appears well-developed and well-nourished.  HENT:  Head: Normocephalic and atraumatic.  Right Ear: Hearing, tympanic membrane, external ear and ear canal normal.  Left Ear: Hearing, tympanic membrane, external ear and ear canal normal.  Nose: Nose normal.  Mouth/Throat: Uvula is midline, oropharynx is clear and moist and mucous membranes are normal.  Eyes: Conjunctivae, EOM and lids are normal. Pupils are equal, round, and reactive to light. Right eye exhibits no discharge. Left eye exhibits no discharge. No scleral icterus.  Neck: Trachea normal. Carotid bruit is not present.  Good range of motion of the neck but with pain on the right around C5-6 and into the trapezius with rotation to the left. No sensory or motor changes in the right arm and the right shoulder has full range of motion  Cardiovascular: Normal rate, regular rhythm, normal heart sounds, intact distal pulses and normal pulses.   No murmur heard. Pulmonary/Chest: Effort normal and breath sounds normal. No respiratory distress. He has no wheezes. He has no rhonchi. He has no rales.  Abdominal: Soft. Normal appearance and bowel sounds are normal. He exhibits no abdominal bruit. There is no tenderness.  Musculoskeletal: Normal range of motion. He exhibits no edema or tenderness.  Right knee has good range of motion with no effusion and no ligamentous laxity. mc Murray negative. No local muscle atrophy  Lymphadenopathy:       Head (right side): No submental, no submandibular, no tonsillar, no preauricular, no posterior auricular and no occipital adenopathy present.       Head (left side): No submental, no submandibular, no tonsillar, no preauricular, no posterior  auricular and no occipital adenopathy present.    He has no cervical adenopathy.  Neurological: He is alert and oriented to person, place, and time. He has normal strength and normal reflexes. No cranial nerve deficit or sensory deficit. Coordination and gait normal.  Skin: Skin is warm, dry and intact. No lesion and no rash noted.  Psychiatric: He has a normal mood and affect. His speech is normal and behavior is normal. Judgment and thought content normal.   BP 165/108 mmHg  Pulse 88  Temp(Src) 97.8 F (36.6 C) (Oral)  Resp 18  Ht 5' 7" (1.702 m)  Wt 202 lb (91.627 kg)  BMI 31.63 kg/m2  SpO2 98%   Wt Readings from Last 3 Encounters:  07/31/15 202 lb (91.627 kg)  07/21/14 203 lb (92.08 kg)  11/03/13 205 lb 9.6 oz (93.26 kg)      Assessment & Plan:  I have completed the patient encounter in its entirety as documented by the scribe, with editing by me where necessary. Beryl Hornberger P. Laney Pastor, M.D.  Annual physical exam - Plan:  CBC with Differential/Platelet, PSA  Idiopathic gout of foot, unspecified chronicity, unspecified laterality - Plan: Uric Acid  Essential hypertension - Plan: Comprehensive metabolic panel  Hyperlipidemia - Plan: Lipid panel  Need for hepatitis C screening test - Plan: Hepatitis C antibody  Special screening for malignant neoplasms, colon - Plan: Ambulatory referral to Gastroenterology  Neck pain  Knee pain, acute, right  BMI 31.0-31.9,adult  -He needs to start a general that this program including flexibility to address his neck and knee problems -Routine labs plus uric acid -Hypertension is not controlled/he believes hydrochlorothiazide precipitated gout in the past so we will add Norvasc and he is warned to watch for edema -He is advised to watch for easy fatigability or chest pain with exercise as he begins to pursue better conditioning  Meds ordered this encounter  Medications  . DISCONTD: lisinopril-hydrochlorothiazide (ZESTORETIC) 20-12.5 MG  tablet    Sig: Take 1 tablet by mouth daily.    Dispense:  90 tablet    Refill:  3  . lisinopril (PRINIVIL,ZESTRIL) 20 MG tablet    Sig: Take 1 tablet (20 mg total) by mouth daily.    Dispense:  90 tablet    Refill:  3  . amLODipine (NORVASC) 5 MG tablet    Sig: Take 1 tablet (5 mg total) by mouth daily.    Dispense:  90 tablet    Refill:  3   Addendum 08/01/2015 labs Results for orders placed or performed in visit on 07/31/15  CBC with Differential/Platelet  Result Value Ref Range   WBC 4.9 4.0 - 10.5 K/uL   RBC 5.14 4.22 - 5.81 MIL/uL   Hemoglobin 16.0 13.0 - 17.0 g/dL   HCT 44.6 39.0 - 52.0 %   MCV 86.8 78.0 - 100.0 fL   MCH 31.1 26.0 - 34.0 pg   MCHC 35.9 30.0 - 36.0 g/dL   RDW 13.1 11.5 - 15.5 %   Platelets 232 150 - 400 K/uL   MPV 9.3 8.6 - 12.4 fL   Neutrophils Relative % 55 43 - 77 %   Neutro Abs 2.7 1.7 - 7.7 K/uL   Lymphocytes Relative 36 12 - 46 %   Lymphs Abs 1.8 0.7 - 4.0 K/uL   Monocytes Relative 7 3 - 12 %   Monocytes Absolute 0.3 0.1 - 1.0 K/uL   Eosinophils Relative 1 0 - 5 %   Eosinophils Absolute 0.0 0.0 - 0.7 K/uL   Basophils Relative 1 0 - 1 %   Basophils Absolute 0.0 0.0 - 0.1 K/uL   Smear Review Criteria for review not met   Comprehensive metabolic panel  Result Value Ref Range   Sodium 144 135 - 146 mmol/L   Potassium 3.8 3.5 - 5.3 mmol/L   Chloride 103 98 - 110 mmol/L   CO2 24 20 - 31 mmol/L   Glucose, Bld 69 65 - 99 mg/dL   BUN 16 7 - 25 mg/dL   Creat 0.98 0.70 - 1.33 mg/dL   Total Bilirubin 0.6 0.2 - 1.2 mg/dL   Alkaline Phosphatase 38 (L) 40 - 115 U/L   AST 20 10 - 35 U/L   ALT 32 9 - 46 U/L   Total Protein 7.9 6.1 - 8.1 g/dL   Albumin 4.9 3.6 - 5.1 g/dL   Calcium 9.9 8.6 - 10.3 mg/dL  Lipid panel  Result Value Ref Range   Cholesterol 281 (H) 125 - 200 mg/dL   Triglycerides 225 (H) <150 mg/dL   HDL 64 >=40  mg/dL   Total CHOL/HDL Ratio 4.4 <=5.0 Ratio   VLDL 45 (H) <30 mg/dL   LDL Cholesterol 172 (H) <130 mg/dL  PSA  Result  Value Ref Range   PSA 1.66 <=4.00 ng/mL  Uric Acid  Result Value Ref Range   Uric Acid, Serum 5.4 4.0 - 7.8 mg/dL  Hepatitis C antibody  Result Value Ref Range   HCV Ab NEGATIVE NEGATIVE   At this level of LDL plus hypertension he will be advised to begin a statin I will also refer him for cardiac stress test

## 2015-07-31 NOTE — Patient Instructions (Signed)

## 2015-08-01 LAB — LIPID PANEL
Cholesterol: 281 mg/dL — ABNORMAL HIGH (ref 125–200)
HDL: 64 mg/dL (ref >=40)
LDL CALC: 172 mg/dL — AB (ref <130)
Total CHOL/HDL Ratio: 4.4 Ratio (ref <=5.0)
Triglycerides: 225 mg/dL — ABNORMAL HIGH (ref <150)
VLDL: 45 mg/dL — ABNORMAL HIGH (ref <30)

## 2015-08-01 LAB — COMPREHENSIVE METABOLIC PANEL
ALT: 32 U/L (ref 9–46)
AST: 20 U/L (ref 10–35)
Albumin: 4.9 g/dL (ref 3.6–5.1)
Alkaline Phosphatase: 38 U/L — ABNORMAL LOW (ref 40–115)
BILIRUBIN TOTAL: 0.6 mg/dL (ref 0.2–1.2)
BUN: 16 mg/dL (ref 7–25)
CALCIUM: 9.9 mg/dL (ref 8.6–10.3)
CO2: 24 mmol/L (ref 20–31)
Chloride: 103 mmol/L (ref 98–110)
Creat: 0.98 mg/dL (ref 0.70–1.33)
GLUCOSE: 69 mg/dL (ref 65–99)
Potassium: 3.8 mmol/L (ref 3.5–5.3)
Sodium: 144 mmol/L (ref 135–146)
Total Protein: 7.9 g/dL (ref 6.1–8.1)

## 2015-08-01 LAB — HEPATITIS C ANTIBODY: HCV Ab: NEGATIVE

## 2015-08-01 LAB — URIC ACID: Uric Acid, Serum: 5.4 mg/dL (ref 4.0–7.8)

## 2015-08-01 LAB — PSA: PSA: 1.66 ng/mL (ref <=4.00)

## 2015-08-01 MED ORDER — ATORVASTATIN CALCIUM 20 MG PO TABS: 20.0000 mg | ORAL_TABLET | Freq: Every day | ORAL | Status: DC

## 2015-08-07 ENCOUNTER — Telehealth: Payer: Self-pay

## 2015-08-07 NOTE — Telephone Encounter (Signed)
[name] is on his release from 2014. WHat should I tell her in summary?

## 2015-08-07 NOTE — Telephone Encounter (Signed)
The patient's wife called to request more information regarding her husband's office visit on 07/31/15 with Dr Merla Richesoolittle.  She is listed on his Release of Information.  She would like someone to call her to discuss the visit.  She is concerned - she said he is "not telling her everything" and he is evasive and will not give her real answers.  She said her husband has not picked up the medication he was prescribed, and has not scheduled his appointments for the referrals that were placed.   Please advise, thank you.  CB#: [phone]

## 2015-08-08 NOTE — Telephone Encounter (Signed)
Ok to tell her what is in the letter--nothing to hide here

## 2015-08-08 NOTE — Telephone Encounter (Signed)
Left message for pt to call back  °

## 2015-08-13 NOTE — Telephone Encounter (Signed)
Dr. Merla Richesoolittle, spoke with [name] she states Aaron Cantu is not taking care of himself. He over eats and his temper is getting worse. He is unable to handle stress very well and wants to know if he can get a brain scan or psychiatrist. I offered her to call around because some psychiatry offices do not need referrals. Please advise.

## 2015-08-14 NOTE — Telephone Encounter (Signed)
Psychiatry--anyone at Dr Cottle's practice---could even start with the psychologist there Marliss CzarAndy Mitchum They require patient to call for appt

## 2015-08-16 NOTE — Telephone Encounter (Signed)
[name] advised.

## 2016-04-01 DIAGNOSIS — Z23 Encounter for immunization: Secondary | ICD-10-CM | POA: Diagnosis not present

## 2016-11-21 ENCOUNTER — Ambulatory Visit (INDEPENDENT_AMBULATORY_CARE_PROVIDER_SITE_OTHER): Payer: BLUE CROSS/BLUE SHIELD | Admitting: Urgent Care

## 2016-11-21 ENCOUNTER — Encounter: Payer: Self-pay | Admitting: Urgent Care

## 2016-11-21 VITALS — BP 186/120 | HR 72 | Temp 98.6°F | Resp 16 | Ht 67.0 in | Wt 207.8 lb

## 2016-11-21 DIAGNOSIS — I10 Essential (primary) hypertension: Secondary | ICD-10-CM | POA: Diagnosis not present

## 2016-11-21 DIAGNOSIS — R030 Elevated blood-pressure reading, without diagnosis of hypertension: Secondary | ICD-10-CM | POA: Diagnosis not present

## 2016-11-21 DIAGNOSIS — Z0000 Encounter for general adult medical examination without abnormal findings: Secondary | ICD-10-CM | POA: Diagnosis not present

## 2016-11-21 DIAGNOSIS — M109 Gout, unspecified: Secondary | ICD-10-CM | POA: Diagnosis not present

## 2016-11-21 DIAGNOSIS — E782 Mixed hyperlipidemia: Secondary | ICD-10-CM | POA: Diagnosis not present

## 2016-11-21 DIAGNOSIS — Z8739 Personal history of other diseases of the musculoskeletal system and connective tissue: Secondary | ICD-10-CM | POA: Diagnosis not present

## 2016-11-21 MED ORDER — LISINOPRIL 20 MG PO TABS: 20.0000 mg | ORAL_TABLET | Freq: Every day | ORAL | 3 refills | Status: DC

## 2016-11-21 MED ORDER — COLCHICINE 0.6 MG PO TABS: ORAL_TABLET | ORAL | 1 refills | Status: DC

## 2016-11-21 MED ORDER — ATORVASTATIN CALCIUM 20 MG PO TABS: 20.0000 mg | ORAL_TABLET | Freq: Every day | ORAL | 3 refills | Status: DC

## 2016-11-21 MED ORDER — AMLODIPINE BESYLATE 5 MG PO TABS: 5.0000 mg | ORAL_TABLET | Freq: Every day | ORAL | 1 refills | Status: DC

## 2016-11-21 NOTE — Progress Notes (Deleted)
    MRN: [id number]  Subjective:   [name] is a 56 y.o. male presenting for annual physical exam and ***.  Medical care team includes: PCP: Patient, No Pcp Per [facility]: *** Dental: *** Specialists:      Health Maintenance:  Aaron Cantu has Foot pain; Gout; HTN (hypertension); and Hyperlipidemia on his problem list.  Aaron Cantu has a current medication list which includes the following prescription(s): omeprazole, amlodipine, atorvastatin, colchicine, and lisinopril. He is allergic to darvon [propoxyphene hcl] and allopurinol.  Aaron Cantu  has a past medical history of Allergy; GERD (gastroesophageal reflux disease); Gout; Hyperlipidemia; and Hypertension. Also  has no past surgical history on file.  family history includes Brain cancer in his brother; Clotting disorder in his mother; Hyperlipidemia in his mother; Hypertension in his mother; Multiple sclerosis in his father.  Immunizations:   ROS  Objective:   Vitals: BP (!) 186/120   Pulse 72   Temp 98.6 F (37 C) (Oral)   Resp 16   Ht 5\' 7"  (1.702 m)   Wt 207 lb 12.8 oz (94.3 kg)   SpO2 98%   BMI 32.55 kg/m   Physical Exam  No results found for this or any previous visit (from the past 24 hour(s)).  Assessment and Plan :     Aaron BambergMario Requan Hardge, PA-C [facility] [phone] 11/21/2016  11:58 AM

## 2016-11-21 NOTE — Patient Instructions (Addendum)
Hypertension Hypertension, commonly called high blood pressure, is when the force of blood pumping through the arteries is too strong. The arteries are the blood vessels that carry blood from the heart throughout the body. Hypertension forces the heart to work harder to pump blood and may cause arteries to become narrow or stiff. Having untreated or uncontrolled hypertension can cause heart attacks, strokes, kidney disease, and other problems. A blood pressure reading consists of a higher number over a lower number. Ideally, your blood pressure should be below 120/80. The first ("top") number is called the systolic pressure. It is a measure of the pressure in your arteries as your heart beats. The second ("bottom") number is called the diastolic pressure. It is a measure of the pressure in your arteries as the heart relaxes. What are the causes? The cause of this condition is not known. What increases the risk? Some risk factors for high blood pressure are under your control. Others are not. Factors you can change  Smoking.  Having type 2 diabetes mellitus, high cholesterol, or both.  Not getting enough exercise or physical activity.  Being overweight.  Having too much fat, sugar, calories, or salt (sodium) in your diet.  Drinking too much alcohol. Factors that are difficult or impossible to change  Having chronic kidney disease.  Having a family history of high blood pressure.  Age. Risk increases with age.  Race. You may be at higher risk if you are African-American.  Gender. Men are at higher risk than women before age 45. After age 65, women are at higher risk than men.  Having obstructive sleep apnea.  Stress. What are the signs or symptoms? Extremely high blood pressure (hypertensive crisis) may cause:  Headache.  Anxiety.  Shortness of breath.  Nosebleed.  Nausea and vomiting.  Severe chest pain.  Jerky movements you cannot control (seizures).  How is this  diagnosed? This condition is diagnosed by measuring your blood pressure while you are seated, with your arm resting on a surface. The cuff of the blood pressure monitor will be placed directly against the skin of your upper arm at the level of your heart. It should be measured at least twice using the same arm. Certain conditions can cause a difference in blood pressure between your right and left arms. Certain factors can cause blood pressure readings to be lower or higher than normal (elevated) for a short period of time:  When your blood pressure is higher when you are in a health care provider's office than when you are at home, this is called white coat hypertension. Most people with this condition do not need medicines.  When your blood pressure is higher at home than when you are in a health care provider's office, this is called masked hypertension. Most people with this condition may need medicines to control blood pressure.  If you have a high blood pressure reading during one visit or you have normal blood pressure with other risk factors:  You may be asked to return on a different day to have your blood pressure checked again.  You may be asked to monitor your blood pressure at home for 1 week or longer.  If you are diagnosed with hypertension, you may have other blood or imaging tests to help your health care provider understand your overall risk for other conditions. How is this treated? This condition is treated by making healthy lifestyle changes, such as eating healthy foods, exercising more, and reducing your alcohol intake. Your   health care provider may prescribe medicine if lifestyle changes are not enough to get your blood pressure under control, and if:  Your systolic blood pressure is above 130.  Your diastolic blood pressure is above 80.  Your personal target blood pressure may vary depending on your medical conditions, your age, and other factors. Follow these  instructions at home: Eating and drinking  Eat a diet that is high in fiber and potassium, and low in sodium, added sugar, and fat. An example eating plan is called the DASH (Dietary Approaches to Stop Hypertension) diet. To eat this way: ? Eat plenty of fresh fruits and vegetables. Try to fill half of your plate at each meal with fruits and vegetables. ? Eat whole grains, such as whole wheat pasta, brown rice, or whole grain bread. Fill about one quarter of your plate with whole grains. ? Eat or drink low-fat dairy products, such as skim milk or low-fat yogurt. ? Avoid fatty cuts of meat, processed or cured meats, and poultry with skin. Fill about one quarter of your plate with lean proteins, such as fish, chicken without skin, beans, eggs, and tofu. ? Avoid premade and processed foods. These tend to be higher in sodium, added sugar, and fat.  Reduce your daily sodium intake. Most people with hypertension should eat less than 1,500 mg of sodium a day.  Limit alcohol intake to no more than 1 drink a day for nonpregnant women and 2 drinks a day for men. One drink equals 12 oz of beer, 5 oz of wine, or 1 oz of hard liquor. Lifestyle  Work with your health care provider to maintain a healthy body weight or to lose weight. Ask what an ideal weight is for you.  Get at least 30 minutes of exercise that causes your heart to beat faster (aerobic exercise) most days of the week. Activities may include walking, swimming, or biking.  Include exercise to strengthen your muscles (resistance exercise), such as pilates or lifting weights, as part of your weekly exercise routine. Try to do these types of exercises for 30 minutes at least 3 days a week.  Do not use any products that contain nicotine or tobacco, such as cigarettes and e-cigarettes. If you need help quitting, ask your health care provider.  Monitor your blood pressure at home as told by your health care provider.  Keep all follow-up visits as  told by your health care provider. This is important. Medicines  Take over-the-counter and prescription medicines only as told by your health care provider. Follow directions carefully. Blood pressure medicines must be taken as prescribed.  Do not skip doses of blood pressure medicine. Doing this puts you at risk for problems and can make the medicine less effective.  Ask your health care provider about side effects or reactions to medicines that you should watch for. Contact a health care provider if:  You think you are having a reaction to a medicine you are taking.  You have headaches that keep coming back (recurring).  You feel dizzy.  You have swelling in your ankles.  You have trouble with your vision. Get help right away if:  You develop a severe headache or confusion.  You have unusual weakness or numbness.  You feel faint.  You have severe pain in your chest or abdomen.  You vomit repeatedly.  You have trouble breathing. Summary  Hypertension is when the force of blood pumping through your arteries is too strong. If this condition is not   controlled, it may put you at risk for serious complications.  Your personal target blood pressure may vary depending on your medical conditions, your age, and other factors. For most people, a normal blood pressure is less than 120/80.  Hypertension is treated with lifestyle changes, medicines, or a combination of both. Lifestyle changes include weight loss, eating a healthy, low-sodium diet, exercising more, and limiting alcohol. This information is not intended to replace advice given to you by your health care provider. Make sure you discuss any questions you have with your health care provider. Document Released: 05/12/2005 Document Revised: 04/09/2016 Document Reviewed: 04/09/2016 Elsevier Interactive Patient Education  2018 [employer]    Gout Gout is painful swelling that can occur in some of your joints. Gout is a  type of arthritis. This condition is caused by having too much uric acid in your body. Uric acid is a chemical that forms when your body breaks down substances called purines. Purines are important for building body proteins. When your body has too much uric acid, sharp crystals can form and build up inside your joints. This causes pain and swelling. Gout attacks can happen quickly and be very painful (acute gout). Over time, the attacks can affect more joints and become more frequent (chronic gout). Gout can also cause uric acid to build up under your skin and inside your kidneys. What are the causes? This condition is caused by too much uric acid in your blood. This can occur because:  Your kidneys do not remove enough uric acid from your blood. This is the most common cause.  Your body makes too much uric acid. This can occur with some cancers and cancer treatments. It can also occur if your body is breaking down too many red blood cells (hemolytic anemia).  You eat too many foods that are high in purines. These foods include organ meats and some seafood. Alcohol, especially beer, is also high in purines.  A gout attack may be triggered by trauma or stress. What increases the risk? This condition is more likely to develop in people who:  Have a family history of gout.  Are male and middle-aged.  Are male and have gone through menopause.  Are obese.  Frequently drink alcohol, especially beer.  Are dehydrated.  Lose weight too quickly.  Have an organ transplant.  Have lead poisoning.  Take certain medicines, including aspirin, cyclosporine, diuretics, levodopa, and niacin.  Have kidney disease or psoriasis.  What are the signs or symptoms? An attack of acute gout happens quickly. It usually occurs in just one joint. The most common place is the big toe. Attacks often start at night. Other joints that may be affected include joints of the feet, ankle, knee, fingers, wrist, or  elbow. Symptoms may include:  Severe pain.  Warmth.  Swelling.  Stiffness.  Tenderness. The affected joint may be very painful to touch.  Shiny, red, or purple skin.  Chills and fever.  Chronic gout may cause symptoms more frequently. More joints may be involved. You may also have white or yellow lumps (tophi) on your hands or feet or in other areas near your joints. How is this diagnosed? This condition is diagnosed based on your symptoms, medical history, and physical exam. You may have tests, such as:  Blood tests to measure uric acid levels.  Removal of joint fluid with a needle (aspiration) to look for uric acid crystals.  X-rays to look for joint damage.  How is this treated? Treatment  for this condition has two phases: treating an acute attack and preventing future attacks. Acute gout treatment may include medicines to reduce pain and swelling, including:  NSAIDs.  Steroids. These are strong anti-inflammatory medicines that can be taken by mouth (orally) or injected into a joint.  Colchicine. This medicine relieves pain and swelling when it is taken soon after an attack. It can be given orally or through an IV tube.  Preventive treatment may include:  Daily use of smaller doses of NSAIDs or colchicine.  Use of a medicine that reduces uric acid levels in your blood.  Changes to your diet. You may need to see a specialist about healthy eating (dietitian).  Follow these instructions at home: During a Gout Attack  If directed, apply ice to the affected area: ? Put ice in a plastic bag. ? Place a towel between your skin and the bag. ? Leave the ice on for 20 minutes, 2-3 times a day.  Rest the joint as much as possible. If the affected joint is in your leg, you may be given crutches to use.  Raise (elevate) the affected joint above the level of your heart as often as possible.  Drink enough fluids to keep your urine clear or pale yellow.  Take  over-the-counter and prescription medicines only as told by your health care provider.  Do not drive or operate heavy machinery while taking prescription pain medicine.  Follow instructions from your health care provider about eating or drinking restrictions.  Return to your normal activities as told by your health care provider. Ask your health care provider what activities are safe for you. Avoiding Future Gout Attacks  Follow a low-purine diet as told by your dietitian or health care provider. Avoid foods and drinks that are high in purines, including liver, kidney, anchovies, asparagus, herring, mushrooms, mussels, and beer.  Limit alcohol intake to no more than 1 drink a day for nonpregnant women and 2 drinks a day for men. One drink equals 12 oz of beer, 5 oz of wine, or 1 oz of hard liquor.  Maintain a healthy weight or lose weight if you are overweight. If you want to lose weight, talk with your health care provider. It is important that you do not lose weight too quickly.  Start or maintain an exercise program as told by your health care provider.  Drink enough fluids to keep your urine clear or pale yellow.  Take over-the-counter and prescription medicines only as told by your health care provider.  Keep all follow-up visits as told by your health care provider. This is important. Contact a health care provider if:  You have another gout attack.  You continue to have symptoms of a gout attack after10 days of treatment.  You have side effects from your medicines.  You have chills or a fever.  You have burning pain when you urinate.  You have pain in your lower back or belly. Get help right away if:  You have severe or uncontrolled pain.  You cannot urinate. This information is not intended to replace advice given to you by your health care provider. Make sure you discuss any questions you have with your health care provider. Document Released: 05/09/2000 Document  Revised: 10/18/2015 Document Reviewed: 02/22/2015 Elsevier Interactive Patient Education  2017 Elsevier Inc.     Cholesterol Cholesterol is a white, waxy, fat-like substance that is needed by the human body in small amounts. The liver makes all the cholesterol we need. Cholesterol  is carried from the liver by the blood through the blood vessels. Deposits of cholesterol (plaques) may build up on blood vessel (artery) walls. Plaques make the arteries narrower and stiffer. Cholesterol plaques increase the risk for heart attack and stroke. You cannot feel your cholesterol level even if it is very high. The only way to know that it is high is to have a blood test. Once you know your cholesterol levels, you should keep a record of the test results. Work with your health care provider to keep your levels in the desired range. What do the results mean?  Total cholesterol is a rough measure of all the cholesterol in your blood.  LDL (low-density lipoprotein) is the "bad" cholesterol. This is the type that causes plaque to build up on the artery walls. You want this level to be low.  HDL (high-density lipoprotein) is the "good" cholesterol because it cleans the arteries and carries the LDL away. You want this level to be high.  Triglycerides are fat that the body can either burn for energy or store. High levels are closely linked to heart disease. What are the desired levels of cholesterol?  Total cholesterol below 200.  LDL below 100 for people who are at risk, below 70 for people at very high risk.  HDL above 40 is good. A level of 60 or higher is considered to be protective against heart disease.  Triglycerides below 150. How can I lower my cholesterol? Diet Follow your diet program as told by your health care provider.  Choose fish or white meat chicken and [place], roasted or baked. Limit fatty cuts of red meat, fried foods, and processed meats, such as sausage and lunch meats.  Eat lots  of fresh fruits and vegetables.  Choose whole grains, beans, pasta, potatoes, and cereals.  Choose olive oil, corn oil, or canola oil, and use only small amounts.  Avoid butter, mayonnaise, shortening, or palm kernel oils.  Avoid foods with trans fats.  Drink skim or nonfat milk and eat low-fat or nonfat yogurt and cheeses. Avoid whole milk, cream, ice cream, egg yolks, and full-fat cheeses.  Healthier desserts include angel food cake, ginger snaps, animal crackers, hard candy, popsicles, and low-fat or nonfat frozen yogurt. Avoid pastries, cakes, pies, and cookies.  Exercise  Follow your exercise program as told by your health care provider. A regular program: ? Helps to decrease LDL and raise HDL. ? Helps with weight control.  Do things that increase your activity level, such as gardening, walking, and taking the stairs.  Ask your health care provider about ways that you can be more active in your daily life.  Medicine  Take over-the-counter and prescription medicines only as told by your health care provider. ? Medicine may be prescribed by your health care provider to help lower cholesterol and decrease the risk for heart disease. This is usually done if diet and exercise have failed to bring down cholesterol levels. ? If you have several risk factors, you may need medicine even if your levels are normal.  This information is not intended to replace advice given to you by your health care provider. Make sure you discuss any questions you have with your health care provider. Document Released: 02/04/2001 Document Revised: 12/08/2015 Document Reviewed: 11/10/2015 Elsevier Interactive Patient Education  2017 [employer].     IF you received an x-ray today, you will receive an invoice from [facility]. Please contact [facility] at [phone] with questions or concerns regarding  your invoice.   IF you received labwork today, you will receive an invoice  from [facility]. Please contact [facility] at [phone] with questions or concerns regarding your invoice.   Our billing staff will not be able to assist you with questions regarding bills from these companies.  You will be contacted with the lab results as soon as they are available. The fastest way to get your results is to activate your My Chart account. Instructions are located on the last page of this paperwork. If you have not heard from [place] regarding the results in 2 weeks, please contact this office.

## 2016-11-21 NOTE — Progress Notes (Signed)
  MRN: [id number] DOB: 02/01/1961  Subjective:   Aaron HugerGary Cantu is a 56 y.o. male presenting for chief complaint of Annual Exam and Hypertension (has not had meds in 3 days )  Gout - Reports 3 week history of intermittent left toe pain. He took allopurinol which has made his symptoms somewhat. He is out of colchicine. Today, reports that he is starting to feel another episode coming on. Has mild left 1st MTP pain with some redness. Denies fever, trauma, bony deformity.   HTN - Patient is currently out of lisinopril, amlodipine. Denies dizziness, chronic headache, blurred vision, chest pain, shortness of breath, heart racing, palpitations, nausea, vomiting, abdominal pain, hematuria, lower leg swelling. Denies smoking cigarettes.  HL - Managed with atorvastatin.   Aaron Cantu has a current medication list which includes the following prescription(s): omeprazole, amlodipine, atorvastatin, colchicine, and lisinopril. Also is allergic to darvon [propoxyphene hcl] and allopurinol. Aaron Cantu  has a past medical history of Allergy; GERD (gastroesophageal reflux disease); Gout; Hyperlipidemia; and Hypertension. Also denies past surgical history.  Objective:   Vitals: BP (!) 186/120   Pulse 72   Temp 98.6 F (37 C) (Oral)   Resp 16   Ht 5\' 7"  (1.702 m)   Wt 207 lb 12.8 oz (94.3 kg)   SpO2 98%   BMI 32.55 kg/m   Physical Exam  Constitutional: He is oriented to person, place, and time. He appears well-developed and well-nourished.  HENT:  Mouth/Throat: Oropharynx is clear and moist.  Eyes: Pupils are equal, round, and reactive to light. No scleral icterus.  Neck: Normal range of motion. Neck supple. No thyromegaly present.  Cardiovascular: Normal rate, regular rhythm and intact distal pulses.  Exam reveals no gallop and no friction rub.   No murmur heard. Pulmonary/Chest: No respiratory distress. He has no wheezes. He has no rales.  Abdominal: Soft. Bowel sounds are normal. He exhibits no distension and no  mass. There is no tenderness. There is no guarding.  Musculoskeletal:       Left foot: There is tenderness (over 1st MTP) and swelling (trace with slight erythema). There is normal range of motion, normal capillary refill, no crepitus, no deformity and no laceration.  Lymphadenopathy:    He has no cervical adenopathy.  Neurological: He is alert and oriented to person, place, and time.  Skin: Skin is warm and dry.  Psychiatric: He has a normal mood and affect.   Assessment and Plan :   1. Essential hypertension 2. Elevated blood pressure reading - Restart BP regimen. Counseled on lifestyle modifications. Recheck in 4 weeks. - Comprehensive metabolic panel pending  3. Acute gout involving toe of left foot, unspecified cause - Hold allopurinol, start colchicine. Return-to-clinic precautions discussed, patient verbalized understanding.  - CBC - Uric Acid  4. Mixed hyperlipidemia - Lipid panel pending  Wallis BambergMario Dirck Butch, PA-C [facility] [phone] 11/21/2016  12:10 PM

## 2016-11-22 LAB — MICROALBUMIN / CREATININE URINE RATIO
CREATININE, UR: 52.3 mg/dL
Microalbumin, Urine: <3 ug/mL

## 2016-11-25 LAB — COMPREHENSIVE METABOLIC PANEL
ALT: 43 IU/L (ref 0–44)
AST: 25 IU/L (ref 0–40)
Albumin/Globulin Ratio: 1.7 (ref 1.2–2.2)
Albumin: 4.7 g/dL (ref 3.5–5.5)
Alkaline Phosphatase: 48 IU/L (ref 39–117)
BUN/Creatinine Ratio: 15 (ref 9–20)
BUN: 15 mg/dL (ref 6–24)
Bilirubin Total: 0.5 mg/dL (ref 0.0–1.2)
CALCIUM: 9.7 mg/dL (ref 8.7–10.2)
CO2: 21 mmol/L (ref 20–29)
CREATININE: 0.99 mg/dL (ref 0.76–1.27)
Chloride: 103 mmol/L (ref 96–106)
GFR calc Af Amer: 97 mL/min/{1.73_m2} (ref >59)
GFR, EST NON AFRICAN AMERICAN: 84 mL/min/{1.73_m2} (ref >59)
GLUCOSE: 107 mg/dL — AB (ref 65–99)
Globulin, Total: 2.7 g/dL (ref 1.5–4.5)
POTASSIUM: 4.3 mmol/L (ref 3.5–5.2)
Sodium: 142 mmol/L (ref 134–144)
TOTAL PROTEIN: 7.4 g/dL (ref 6.0–8.5)

## 2016-11-25 LAB — URIC ACID: URIC ACID: 6.5 mg/dL (ref 3.7–8.6)

## 2016-11-25 LAB — CBC

## 2016-11-25 LAB — LIPID PANEL
Chol/HDL Ratio: 4.8 ratio (ref 0.0–5.0)
Cholesterol, Total: 242 mg/dL — ABNORMAL HIGH (ref 100–199)
HDL: 50 mg/dL (ref >39)
LDL Calculated: 142 mg/dL — ABNORMAL HIGH (ref 0–99)
Triglycerides: 248 mg/dL — ABNORMAL HIGH (ref 0–149)
VLDL CHOLESTEROL CAL: 50 mg/dL — AB (ref 5–40)

## 2016-11-25 LAB — TSH: TSH: 4.44 u[IU]/mL (ref 0.450–4.500)

## 2016-12-02 ENCOUNTER — Telehealth: Payer: Self-pay | Admitting: Urgent Care

## 2016-12-02 NOTE — Telephone Encounter (Signed)
Pt's wife would like a CB concerning her husband's health. She is very worried about his [facility] Please advise at [phone]

## 2016-12-03 ENCOUNTER — Telehealth: Payer: Self-pay | Admitting: Urgent Care

## 2016-12-03 NOTE — Telephone Encounter (Signed)
Pts wife calling in to say that her husband is refusing to take his bp medication & shes afraid that something can happen to her husband. Hes taking lisinopril & not taking the amlopidine. He says that he already discussed it w/ [facility], hes being very hard headed about it. Shes just concerned this was not discussed.   Please Advise

## 2016-12-03 NOTE — Telephone Encounter (Signed)
Spoke with patient's wife and she said that her husband's refuses to recheck his BP and refuses to do what she says. She stated that yesterday patient said that he did not feel good, he went to lye down and ended up sleeping for hours.  Advised patient that she should make an appointment for patient and she stated that if she makes the appointment for him he will cancel it. Patient's wife would like for [facility] to give him a call and tell him that he needs to come in for an appointment because if she does it he will cancel it and not go.

## 2016-12-04 NOTE — Telephone Encounter (Signed)
Left VM for patient's wife. I see that patient has an OV on 12/19/2016 with me. Will review HTN concerns with patient then. Patient's wife [name] is welcome to call back to discuss any other concerns she may have. She is to leave a reliable time for me to reach her.

## 2016-12-05 NOTE — Telephone Encounter (Signed)
Mani left message on wife's VM. Patient has appointment with [facility] the end of the month. Advised wife to call back with a reliable [facility] for [facility].

## 2016-12-19 ENCOUNTER — Ambulatory Visit: Payer: BLUE CROSS/BLUE SHIELD | Admitting: Urgent Care

## 2017-01-29 ENCOUNTER — Ambulatory Visit: Payer: BLUE CROSS/BLUE SHIELD | Admitting: Urgent Care

## 2017-03-31 DIAGNOSIS — Z23 Encounter for immunization: Secondary | ICD-10-CM | POA: Diagnosis not present

## 2017-08-10 ENCOUNTER — Telehealth: Payer: Self-pay | Admitting: Urgent Care

## 2017-08-10 NOTE — Telephone Encounter (Signed)
Wife called to report their daughter tested positive for the flu. Over the weekend he had a fever, unsure of exact reading. Body aches are gone. Still has a cough - requests a prescription for Tamiflu be sent to [facility] on [address] Call back number [phone].

## 2017-08-10 NOTE — Telephone Encounter (Signed)
Phone call to Aaron Cantu, unable to reach.  If Aaron Cantu (or Aaron Cantu) calls back ask when his symptoms started. If it has been 48 hours or more since he started having symptoms, Tamiflu will not be helpful. If it has been more than 48 hours, would recommend office visit if medication is requested or if he wants to discuss symptoms with provider.

## 2017-08-11 ENCOUNTER — Ambulatory Visit: Payer: Self-pay | Admitting: *Deleted

## 2017-08-11 NOTE — Telephone Encounter (Signed)
Patient's wife is calling to report that patient's daughter was diagnosed with the flu- the whole family has started having flu symptoms. They are requesting Rx for Tamiflu. They use the [employer] [place] Answer Assessment - Initial Assessment Questions 1. TYPE of EXPOSURE: "How were you exposed?" (e.g., close contact, not a close contact)     daughter 2. DATE of EXPOSURE: "When did the exposure occur?" (e.g., hour, days, weeks)     Last Thursday daughter got sick- she tested positive on Monday 3. PREGNANCY: "Is there any chance you are pregnant?" "When was your last menstrual period?"     n/a 4. HIGH RISK for COMPLICATIONS: "Do you have any heart or lung problems? Do you have a weakened immune system?" (e.g., CHF, COPD, asthma, HIV positive, chemotherapy, renal failure, diabetes mellitus, sickle cell anemia)     No- but wife is immune compromised and is also having symptoms 5. SYMPTOMS: "Do you have any symptoms?" (e.g., cough, fever, sore throat, difficulty breathing).     Fever,body aches,cough,congestion  Protocols used: INFLUENZA EXPOSURE-A-AH

## 2017-08-11 NOTE — Telephone Encounter (Signed)
Please have them set up an office visit.

## 2017-08-12 ENCOUNTER — Telehealth: Payer: Self-pay | Admitting: Urgent Care

## 2017-08-12 ENCOUNTER — Ambulatory Visit: Payer: Self-pay

## 2017-08-12 NOTE — Telephone Encounter (Signed)
Called pt to try and get them [facility] Left VM per DPR.  If pt (or his wife - [name]) calls back, please schedule him an appt with [facility] at his convenience.  Thanks!

## 2017-09-18 ENCOUNTER — Telehealth: Payer: Self-pay | Admitting: Urgent Care

## 2017-09-18 ENCOUNTER — Other Ambulatory Visit: Payer: Self-pay | Admitting: Urgent Care

## 2017-09-18 DIAGNOSIS — I10 Essential (primary) hypertension: Secondary | ICD-10-CM

## 2017-09-18 NOTE — Telephone Encounter (Signed)
Copied from CRM [id number]. Topic: Quick Communication - See Telephone Encounter >> Sep 18, 2017  9:41 AM Everardo PacificMoton, Marelyn Rouser, NT wrote: CRM for notification. Patients wife is calling on behalf of her husband because they would like to know if [facility] could call him in enough medication for his BP Lisinopril and Amlodipine so that he would have enough to hold him over until he comes into the office for his appointment on 09-22-2017 @ 8:20. Stated that they will need the medication to go to the [facility] [address] [phone]

## 2017-09-19 NOTE — Telephone Encounter (Signed)
Filled in another encounter

## 2017-09-22 ENCOUNTER — Ambulatory Visit: Payer: BLUE CROSS/BLUE SHIELD | Admitting: Urgent Care

## 2017-09-24 ENCOUNTER — Ambulatory Visit: Payer: BLUE CROSS/BLUE SHIELD | Admitting: Urgent Care

## 2017-09-24 ENCOUNTER — Other Ambulatory Visit: Payer: Self-pay

## 2017-09-24 ENCOUNTER — Encounter: Payer: Self-pay | Admitting: Urgent Care

## 2017-09-24 VITALS — BP 154/90 | HR 79 | Temp 98.8°F | Ht 67.0 in | Wt 203.2 lb

## 2017-09-24 DIAGNOSIS — R0789 Other chest pain: Secondary | ICD-10-CM

## 2017-09-24 DIAGNOSIS — I10 Essential (primary) hypertension: Secondary | ICD-10-CM

## 2017-09-24 DIAGNOSIS — Z6831 Body mass index (BMI) 31.0-31.9, adult: Secondary | ICD-10-CM | POA: Diagnosis not present

## 2017-09-24 DIAGNOSIS — E782 Mixed hyperlipidemia: Secondary | ICD-10-CM

## 2017-09-24 DIAGNOSIS — E669 Obesity, unspecified: Secondary | ICD-10-CM | POA: Diagnosis not present

## 2017-09-24 DIAGNOSIS — R739 Hyperglycemia, unspecified: Secondary | ICD-10-CM | POA: Diagnosis not present

## 2017-09-24 LAB — LIPID PANEL
CHOLESTEROL TOTAL: 249 mg/dL — AB (ref 100–199)
Chol/HDL Ratio: 4.7 ratio (ref 0.0–5.0)
HDL: 53 mg/dL (ref >39)
LDL Calculated: 161 mg/dL — ABNORMAL HIGH (ref 0–99)
TRIGLYCERIDES: 176 mg/dL — AB (ref 0–149)
VLDL Cholesterol Cal: 35 mg/dL (ref 5–40)

## 2017-09-24 LAB — HEMOGLOBIN A1C
ESTIMATED AVERAGE GLUCOSE: 103 mg/dL
Hgb A1c MFr Bld: 5.2 % (ref 4.8–5.6)

## 2017-09-24 LAB — COMPREHENSIVE METABOLIC PANEL
ALBUMIN: 5.1 g/dL (ref 3.5–5.5)
ALK PHOS: 47 IU/L (ref 39–117)
ALT: 42 IU/L (ref 0–44)
AST: 29 IU/L (ref 0–40)
Albumin/Globulin Ratio: 1.9 (ref 1.2–2.2)
BILIRUBIN TOTAL: 0.5 mg/dL (ref 0.0–1.2)
BUN / CREAT RATIO: 19 (ref 9–20)
BUN: 18 mg/dL (ref 6–24)
CHLORIDE: 100 mmol/L (ref 96–106)
CO2: 20 mmol/L (ref 20–29)
CREATININE: 0.94 mg/dL (ref 0.76–1.27)
Calcium: 9.7 mg/dL (ref 8.7–10.2)
GFR calc Af Amer: 103 mL/min/{1.73_m2} (ref >59)
GFR calc non Af Amer: 89 mL/min/{1.73_m2} (ref >59)
GLOBULIN, TOTAL: 2.7 g/dL (ref 1.5–4.5)
Glucose: 116 mg/dL — ABNORMAL HIGH (ref 65–99)
POTASSIUM: 3.9 mmol/L (ref 3.5–5.2)
SODIUM: 140 mmol/L (ref 134–144)
Total Protein: 7.8 g/dL (ref 6.0–8.5)

## 2017-09-24 MED ORDER — AMLODIPINE BESYLATE 5 MG PO TABS: 5.0000 mg | ORAL_TABLET | Freq: Every day | ORAL | 1 refills | Status: DC

## 2017-09-24 MED ORDER — LISINOPRIL 30 MG PO TABS: 30.0000 mg | ORAL_TABLET | Freq: Every day | ORAL | 1 refills | Status: DC

## 2017-09-24 MED ORDER — FENOFIBRATE 54 MG PO TABS: 54.0000 mg | ORAL_TABLET | Freq: Every day | ORAL | 1 refills | Status: AC

## 2017-09-24 NOTE — Patient Instructions (Addendum)
Avoid salt in your foods.   Salads - kale, spinach, cabbage, spring mix; use seeds like pumpkin seeds or sunflower seeds; you can also use 1-2 hard boiled eggs. Fruits - avocadoes, berries (blueberries, raspberries, blackberries), apples, oranges, pomegranate, grapefruit Seeds - quinoa, chia seeds; you can also incorporate oatmeal Vegetables - aspargus, cauliflower, broccoli, green beans, brussel spouts, bell peppers; stay away from starchy vegetables like potatoes, carrots, peas  Brussel sprouts - Cut off stems. Place in a mixing bowl that has a lid. Pour in a 1/4-1/2 cup olive oil, spices, use a light amount of parmesan. Place on a baking sheet. Bake for 10 minutes at 400F. Take it out, eat the brussel chips. Place for another 5-10 minutes.   Mashed cauliflower - Boil a bunch of cauliflower in a pot of water. Blend in a food processor with 1-2 tablespoons of butter.  Spaghetti squash -  Cut the squash in half very carefully, clean out seeds from the middle. Place 1/2 face down in a microwave safe dish with at least 2 inches of water. Make 4-6 slits on outside of spaghetti squash and microwave for 10-12 minutes. Take out the spaghetti using a metal spoon. Repeat for the other half.   Vega protein is good protein powder, make sure you use ~6 ice cubes to give it smoothie consistency together with ~4-6 ounces of vanilla soy milk. Throw cinnamon into your shake, use peanut butter. You can also use the fruits that I listed above. Throw spinach or kale into the shake.      Hypertension Hypertension, commonly called high blood pressure, is when the force of blood pumping through the arteries is too strong. The arteries are the blood vessels that carry blood from the heart throughout the body. Hypertension forces the heart to work harder to pump blood and may cause arteries to become narrow or stiff. Having untreated or uncontrolled hypertension can cause heart attacks, strokes, kidney disease, and  other problems. A blood pressure reading consists of a higher number over a lower number. Ideally, your blood pressure should be below 120/80. The first ("top") number is called the systolic pressure. It is a measure of the pressure in your arteries as your heart beats. The second ("bottom") number is called the diastolic pressure. It is a measure of the pressure in your arteries as the heart relaxes. What are the causes? The cause of this condition is not known. What increases the risk? Some risk factors for high blood pressure are under your control. Others are not. Factors you can change  Smoking.  Having type 2 diabetes mellitus, high cholesterol, or both.  Not getting enough exercise or physical activity.  Being overweight.  Having too much fat, sugar, calories, or salt (sodium) in your diet.  Drinking too much alcohol. Factors that are difficult or impossible to change  Having chronic kidney disease.  Having a family history of high blood pressure.  Age. Risk increases with age.  Race. You may be at higher risk if you are African-American.  Gender. Men are at higher risk than women before age 55. After age 100, women are at higher risk than men.  Having obstructive sleep apnea.  Stress. What are the signs or symptoms? Extremely high blood pressure (hypertensive crisis) may cause:  Headache.  Anxiety.  Shortness of breath.  Nosebleed.  Nausea and vomiting.  Severe chest pain.  Jerky movements you cannot control (seizures).  How is this diagnosed? This condition is diagnosed by measuring your blood  pressure while you are seated, with your arm resting on a surface. The cuff of the blood pressure monitor will be placed directly against the skin of your upper arm at the level of your heart. It should be measured at least twice using the same arm. Certain conditions can cause a difference in blood pressure between your right and left arms. Certain factors can cause  blood pressure readings to be lower or higher than normal (elevated) for a short period of time:  When your blood pressure is higher when you are in a health care provider's office than when you are at home, this is called white coat hypertension. Most people with this condition do not need medicines.  When your blood pressure is higher at home than when you are in a health care provider's office, this is called masked hypertension. Most people with this condition may need medicines to control blood pressure.  If you have a high blood pressure reading during one visit or you have normal blood pressure with other risk factors:  You may be asked to return on a different day to have your blood pressure checked again.  You may be asked to monitor your blood pressure at home for 1 week or longer.  If you are diagnosed with hypertension, you may have other blood or imaging tests to help your health care provider understand your overall risk for other conditions. How is this treated? This condition is treated by making healthy lifestyle changes, such as eating healthy foods, exercising more, and reducing your alcohol intake. Your health care provider may prescribe medicine if lifestyle changes are not enough to get your blood pressure under control, and if:  Your systolic blood pressure is above 130.  Your diastolic blood pressure is above 80.  Your personal target blood pressure may vary depending on your medical conditions, your age, and other factors. Follow these instructions at home: Eating and drinking  Eat a diet that is high in fiber and potassium, and low in sodium, added sugar, and fat. An example eating plan is called the DASH (Dietary Approaches to Stop Hypertension) diet. To eat this way: ? Eat plenty of fresh fruits and vegetables. Try to fill half of your plate at each meal with fruits and vegetables. ? Eat whole grains, such as whole wheat pasta, brown rice, or whole grain bread.  Fill about one quarter of your plate with whole grains. ? Eat or drink low-fat dairy products, such as skim milk or low-fat yogurt. ? Avoid fatty cuts of meat, processed or cured meats, and poultry with skin. Fill about one quarter of your plate with lean proteins, such as fish, chicken without skin, beans, eggs, and tofu. ? Avoid premade and processed foods. These tend to be higher in sodium, added sugar, and fat.  Reduce your daily sodium intake. Most people with hypertension should eat less than 1,500 mg of sodium a day.  Limit alcohol intake to no more than 1 drink a day for nonpregnant women and 2 drinks a day for men. One drink equals 12 oz of beer, 5 oz of wine, or 1 oz of hard liquor. Lifestyle  Work with your health care provider to maintain a healthy body weight or to lose weight. Ask what an ideal weight is for you.  Get at least 30 minutes of exercise that causes your heart to beat faster (aerobic exercise) most days of the week. Activities may include walking, swimming, or biking.  Include exercise to strengthen  your muscles (resistance exercise), such as pilates or lifting weights, as part of your weekly exercise routine. Try to do these types of exercises for 30 minutes at least 3 days a week.  Do not use any products that contain nicotine or tobacco, such as cigarettes and e-cigarettes. If you need help quitting, ask your health care provider.  Monitor your blood pressure at home as told by your health care provider.  Keep all follow-up visits as told by your health care provider. This is important. Medicines  Take over-the-counter and prescription medicines only as told by your health care provider. Follow directions carefully. Blood pressure medicines must be taken as prescribed.  Do not skip doses of blood pressure medicine. Doing this puts you at risk for problems and can make the medicine less effective.  Ask your health care provider about side effects or reactions  to medicines that you should watch for. Contact a health care provider if:  You think you are having a reaction to a medicine you are taking.  You have headaches that keep coming back (recurring).  You feel dizzy.  You have swelling in your ankles.  You have trouble with your vision. Get help right away if:  You develop a severe headache or confusion.  You have unusual weakness or numbness.  You feel faint.  You have severe pain in your chest or abdomen.  You vomit repeatedly.  You have trouble breathing. Summary  Hypertension is when the force of blood pumping through your arteries is too strong. If this condition is not controlled, it may put you at risk for serious complications.  Your personal target blood pressure may vary depending on your medical conditions, your age, and other factors. For most people, a normal blood pressure is less than 120/80.  Hypertension is treated with lifestyle changes, medicines, or a combination of both. Lifestyle changes include weight loss, eating a healthy, low-sodium diet, exercising more, and limiting alcohol. This information is not intended to replace advice given to you by your health care provider. Make sure you discuss any questions you have with your health care provider. Document Released: 05/12/2005 Document Revised: 04/09/2016 Document Reviewed: 04/09/2016 Elsevier Interactive Patient Education  [employer].

## 2017-09-24 NOTE — Progress Notes (Addendum)
MRN: [id number] DOB: 10-07-1960  Subjective:   Aaron Cantu is a 57 y.o. male presenting for follow up on Hypertension. Currently managed with amlodipine and lisinopril. Patient is checking blood pressure at home, generally 140's systolic.  Patient's diet is noncompliant, does not avoid salt and does not practice overall healthy diet, does not exercise.  Patient also reports 2-3 weekly episodes of transient mid to left sided chest pain that radiates into his back for the past year.  He also gets intermittent headaches.  Denies association between the 2 symptoms.  Denies diaphoresis, radiation of his chest pain into left arm neck or jaw, dizziness, blurred vision, shortness of breath, heart racing, palpitations, nausea, vomiting, abdominal pain, hematuria, lower leg swelling. Denies smoking cigarettes.  Regarding his hyperlipidemia, patient was unable to tolerate atorvastatin so he stopped taking this on his own after 2 weeks of persistent nausea while taking this medication.  Phuc has a current medication list which includes the following prescription(s): amlodipine, colchicine, lisinopril, and omeprazole. Also is allergic to darvon [propoxyphene hcl] and allopurinol.  Romel  has a past medical history of Allergy, GERD (gastroesophageal reflux disease), Gout, Hyperlipidemia, and Hypertension. Also  has no past surgical history on file.  Objective:   Vitals: BP (!) 154/90 (BP Location: Left Arm)   Pulse 79   Temp 98.8 F (37.1 C) (Oral)   Ht  (1.702 m)   Wt 203 lb 3.2 oz (92.2 kg)   SpO2 98%   BMI 31.83 kg/m   BP Readings from Last 3 Encounters:  09/24/17 (!) 154/90  11/21/16 (!) 186/120  07/31/15 (!) 165/108   The 10-year ASCVD risk score Denman George DC Jr., et al., 2013) is: 14%   Values used to calculate the score:     Age: 87 years     Sex: Male     Is Non-Hispanic African American: No     Diabetic: No     Tobacco smoker: No     Systolic Blood Pressure: 154 mmHg     Is BP treated:  Yes     HDL Cholesterol: 50 mg/dL     Total Cholesterol: 242 mg/dL    Physical Exam  Constitutional: He is oriented to person, place, and time. He appears well-developed and well-nourished.  HENT:  Mouth/Throat: Oropharynx is clear and moist.  Eyes: Pupils are equal, round, and reactive to light. EOM are normal. No scleral icterus.  Neck: Normal range of motion. Neck supple. No thyromegaly present.  Cardiovascular: Normal rate, regular rhythm and intact distal pulses. Exam reveals no gallop and no friction rub.  No murmur heard. Pulmonary/Chest: No respiratory distress. He has no wheezes. He has no rales.  Abdominal: Soft. Bowel sounds are normal. He exhibits no distension and no mass. There is no tenderness. There is no rebound and no guarding.  Neurological: He is alert and oriented to person, place, and time.  Skin: Skin is warm and dry.  Psychiatric: He has a normal mood and affect.   ECG interpretation - normal sinus rhythm at 69 bpm.   Assessment and Plan :   Essential hypertension - Plan: Comprehensive metabolic panel, Lipid panel, lisinopril (PRINIVIL,ZESTRIL) 30 MG tablet  Elevated blood sugar  Mixed hyperlipidemia  Atypical chest pain - Plan: EKG 12-Lead, Ambulatory referral to Cardiology  Class 1 obesity without serious comorbidity with body mass index (BMI) of 31.0 to 31.9 in adult, unspecified obesity type - Plan: Hemoglobin A1c  Patient has uncontrolled hypertension.  We discussed dietary and  lifestyle modifications in general.  Patient is to increase lisinopril from 20 mg to 30 mg.  We will maintain his amlodipine.  Instead of atorvastatin we will use fenofibrate for his mixed hyperlipidemia.  I will refer patient to cardiology for consult on his atypical chest pain.  ER and return to clinic precautions discussed.  Otherwise follow-up in 3 months.  Wallis Bamberg, PA-C Primary Care at [facility] [phone] 09/24/2017  8:33 AM

## 2017-11-21 ENCOUNTER — Telehealth: Payer: Self-pay

## 2017-11-21 NOTE — Telephone Encounter (Signed)
Copied from CRM [id number]. Topic: Referral - Request >> Nov 20, 2017  5:43 PM Debroah LoopLander, Lumin L wrote: Reason for CRM: Patient's wife calling needing an urgent referral to GI a soon as possible. It needs to be in network and patient [facility] [phone] patient needs a colonoscopy. Patient's wife would like a call back once placed.

## 2017-12-11 DIAGNOSIS — R002 Palpitations: Secondary | ICD-10-CM | POA: Diagnosis not present

## 2017-12-11 DIAGNOSIS — I10 Essential (primary) hypertension: Secondary | ICD-10-CM | POA: Diagnosis not present

## 2017-12-11 DIAGNOSIS — R0789 Other chest pain: Secondary | ICD-10-CM | POA: Diagnosis not present

## 2017-12-11 DIAGNOSIS — E782 Mixed hyperlipidemia: Secondary | ICD-10-CM | POA: Diagnosis not present

## 2017-12-17 ENCOUNTER — Telehealth: Payer: Self-pay | Admitting: Urgent Care

## 2017-12-17 NOTE — Telephone Encounter (Signed)
Copied from CRM [id number]. Topic: General - Other >> Dec 17, 2017 11:04 AM Gaynelle AduPoole, Shalonda wrote: Reason for CRM:  Patient wife called and requested a referral for a psychiatric for Depression, and she is thinking he may have  Bipolar issues as well. Please advise

## 2017-12-23 NOTE — Telephone Encounter (Signed)
Please advise. Dgaddy, CMA 

## 2017-12-23 NOTE — Telephone Encounter (Signed)
Pts wife calling to check status on the referral request. She also states he is not being compliant with medical advice.

## 2017-12-24 NOTE — Telephone Encounter (Signed)
I have only seen patient for hypertension, hyperlipidemia.  I am not sure about any depression or bipolar disorder.  Patient needs an office visit.

## 2017-12-24 NOTE — Telephone Encounter (Signed)
Spoke with pt, he is not aware that this request for an appt was made by his wife and I did not disclose. He says he is not aware of any bipolar or depression assessment. No appt needed:-)

## 2017-12-25 ENCOUNTER — Ambulatory Visit: Payer: BLUE CROSS/BLUE SHIELD | Admitting: Urgent Care

## 2017-12-31 ENCOUNTER — Ambulatory Visit: Payer: BLUE CROSS/BLUE SHIELD | Admitting: Urgent Care

## 2018-01-01 ENCOUNTER — Telehealth: Payer: Self-pay | Admitting: Urgent Care

## 2018-01-01 NOTE — Telephone Encounter (Signed)
Called pt to let them know we would need to reschedule their appt 01/26/18. Rescheduled °

## 2018-01-06 ENCOUNTER — Institutional Professional Consult (permissible substitution): Payer: Self-pay | Admitting: Pulmonary Disease

## 2018-01-11 DIAGNOSIS — R0789 Other chest pain: Secondary | ICD-10-CM | POA: Diagnosis not present

## 2018-01-11 DIAGNOSIS — I10 Essential (primary) hypertension: Secondary | ICD-10-CM | POA: Diagnosis not present

## 2018-01-11 DIAGNOSIS — R002 Palpitations: Secondary | ICD-10-CM | POA: Diagnosis not present

## 2018-01-21 ENCOUNTER — Institutional Professional Consult (permissible substitution): Payer: Self-pay | Admitting: Pulmonary Disease

## 2018-01-26 ENCOUNTER — Ambulatory Visit: Payer: BLUE CROSS/BLUE SHIELD | Admitting: Urgent Care

## 2018-01-27 ENCOUNTER — Ambulatory Visit: Payer: BLUE CROSS/BLUE SHIELD | Admitting: Urgent Care

## 2018-02-05 ENCOUNTER — Institutional Professional Consult (permissible substitution): Payer: Self-pay | Admitting: Pulmonary Disease

## 2018-02-09 DIAGNOSIS — R0789 Other chest pain: Secondary | ICD-10-CM | POA: Diagnosis not present

## 2018-02-09 DIAGNOSIS — R002 Palpitations: Secondary | ICD-10-CM | POA: Diagnosis not present

## 2018-02-09 DIAGNOSIS — I10 Essential (primary) hypertension: Secondary | ICD-10-CM | POA: Diagnosis not present

## 2018-02-17 ENCOUNTER — Ambulatory Visit: Payer: BLUE CROSS/BLUE SHIELD | Admitting: Urgent Care

## 2018-03-23 DIAGNOSIS — R0789 Other chest pain: Secondary | ICD-10-CM | POA: Diagnosis not present

## 2018-03-23 DIAGNOSIS — K219 Gastro-esophageal reflux disease without esophagitis: Secondary | ICD-10-CM | POA: Diagnosis not present

## 2018-03-23 DIAGNOSIS — R002 Palpitations: Secondary | ICD-10-CM | POA: Diagnosis not present

## 2018-03-23 DIAGNOSIS — I10 Essential (primary) hypertension: Secondary | ICD-10-CM | POA: Diagnosis not present

## 2018-05-24 ENCOUNTER — Ambulatory Visit: Payer: Self-pay | Admitting: *Deleted

## 2018-05-24 NOTE — Telephone Encounter (Signed)
Pts wife called, TN asked to speak with patient. Pt refused, "I feel fine, I don't need anything."  Wife states their daughter has flu, all in family given Tamiflu except patient. States pt stated feeling ill yesterday.  Pt refuses triage, again stating "I feel fine.I don't need anything." Instructed to CB if flu like symptoms occur.

## 2018-07-15 ENCOUNTER — Other Ambulatory Visit: Payer: Self-pay | Admitting: Cardiology

## 2018-07-22 ENCOUNTER — Ambulatory Visit: Payer: Self-pay | Admitting: Cardiology

## 2018-08-17 ENCOUNTER — Other Ambulatory Visit: Payer: Self-pay | Admitting: Cardiology

## 2018-08-19 ENCOUNTER — Ambulatory Visit: Payer: Self-pay | Admitting: Cardiology

## 2018-09-20 ENCOUNTER — Other Ambulatory Visit: Payer: Self-pay | Admitting: Cardiology

## 2018-09-21 ENCOUNTER — Other Ambulatory Visit: Payer: Self-pay | Admitting: Cardiology

## 2018-09-27 ENCOUNTER — Other Ambulatory Visit: Payer: Self-pay | Admitting: Cardiology

## 2018-10-12 ENCOUNTER — Ambulatory Visit: Payer: Self-pay | Admitting: Cardiology

## 2018-11-03 ENCOUNTER — Ambulatory Visit: Payer: Self-pay | Admitting: Cardiology

## 2018-11-25 ENCOUNTER — Ambulatory Visit: Payer: Self-pay | Admitting: Cardiology

## 2018-12-08 ENCOUNTER — Telehealth: Payer: Self-pay

## 2018-12-08 NOTE — Telephone Encounter (Signed)
Wife called and said patient really needs to be seen. Patient continue

## 2018-12-13 ENCOUNTER — Telehealth: Payer: Self-pay

## 2018-12-13 NOTE — Telephone Encounter (Signed)
Beta blockers do not cause gout, but if he is sure that it is, he can stop and see how it goes.  Diet and weight loss will help.

## 2018-12-13 NOTE — Telephone Encounter (Signed)
Pt called and said that he has stopped his  Atenolol because it triggers his gout.

## 2018-12-14 ENCOUNTER — Ambulatory Visit: Payer: Self-pay | Admitting: Cardiology

## 2018-12-14 NOTE — Telephone Encounter (Signed)
Lvm for pt stop atenolol and let us know how he does.

## 2018-12-18 DIAGNOSIS — Z20828 Contact with and (suspected) exposure to other viral communicable diseases: Secondary | ICD-10-CM | POA: Diagnosis not present

## 2018-12-22 ENCOUNTER — Other Ambulatory Visit: Payer: Self-pay | Admitting: Cardiology

## 2019-01-03 ENCOUNTER — Ambulatory Visit: Payer: Self-pay | Admitting: Cardiology

## 2019-01-19 ENCOUNTER — Ambulatory Visit: Payer: Self-pay | Admitting: Cardiology

## 2019-01-19 DIAGNOSIS — Z20828 Contact with and (suspected) exposure to other viral communicable diseases: Secondary | ICD-10-CM | POA: Diagnosis not present

## 2019-02-17 ENCOUNTER — Other Ambulatory Visit: Payer: Self-pay | Admitting: Cardiology

## 2019-02-21 ENCOUNTER — Ambulatory Visit: Payer: Self-pay | Admitting: Cardiology

## 2019-03-21 ENCOUNTER — Ambulatory Visit: Payer: Self-pay | Admitting: Cardiology

## 2019-04-12 ENCOUNTER — Other Ambulatory Visit: Payer: Self-pay | Admitting: Cardiology

## 2019-04-15 ENCOUNTER — Other Ambulatory Visit: Payer: Self-pay | Admitting: Cardiology

## 2019-04-15 ENCOUNTER — Other Ambulatory Visit: Payer: Self-pay

## 2019-04-15 MED ORDER — ATENOLOL 100 MG PO TABS: 100.0000 mg | ORAL_TABLET | Freq: Every day | ORAL | 0 refills | Status: DC

## 2019-04-15 MED ORDER — VALSARTAN 320 MG PO TABS: 320.0000 mg | ORAL_TABLET | Freq: Every day | ORAL | 0 refills | Status: DC

## 2019-05-04 ENCOUNTER — Ambulatory Visit: Payer: Self-pay | Admitting: Cardiology

## 2019-05-05 ENCOUNTER — Other Ambulatory Visit: Payer: Self-pay | Admitting: Cardiology

## 2019-05-16 DIAGNOSIS — Z20828 Contact with and (suspected) exposure to other viral communicable diseases: Secondary | ICD-10-CM | POA: Diagnosis not present

## 2019-06-06 DIAGNOSIS — Z20828 Contact with and (suspected) exposure to other viral communicable diseases: Secondary | ICD-10-CM | POA: Diagnosis not present

## 2019-06-13 ENCOUNTER — Ambulatory Visit: Payer: Self-pay | Admitting: Cardiology

## 2019-06-29 ENCOUNTER — Ambulatory Visit: Payer: Self-pay | Admitting: Cardiology

## 2019-07-06 ENCOUNTER — Other Ambulatory Visit: Payer: Self-pay

## 2019-07-06 DIAGNOSIS — M109 Gout, unspecified: Secondary | ICD-10-CM

## 2019-07-06 MED ORDER — COLCHICINE 0.6 MG PO TABS: ORAL_TABLET | ORAL | 0 refills | Status: DC

## 2019-07-11 ENCOUNTER — Other Ambulatory Visit: Payer: Self-pay | Admitting: Cardiology

## 2019-07-11 NOTE — Telephone Encounter (Signed)
Patient is on Lisinopril. Is this ok to refill? When I tried to refill  a "New warning" came up, if patient could take both. Please advise.

## 2019-07-31 ENCOUNTER — Other Ambulatory Visit: Payer: Self-pay | Admitting: Cardiology

## 2019-08-01 NOTE — Telephone Encounter (Signed)
Advise due to numerous cancellations

## 2019-08-03 DIAGNOSIS — Z20828 Contact with and (suspected) exposure to other viral communicable diseases: Secondary | ICD-10-CM | POA: Diagnosis not present

## 2019-08-03 DIAGNOSIS — Z03818 Encounter for observation for suspected exposure to other biological agents ruled out: Secondary | ICD-10-CM | POA: Diagnosis not present

## 2019-08-10 ENCOUNTER — Other Ambulatory Visit: Payer: Self-pay | Admitting: Cardiology

## 2019-08-16 ENCOUNTER — Ambulatory Visit: Payer: Self-pay | Admitting: Cardiology

## 2019-09-09 ENCOUNTER — Other Ambulatory Visit: Payer: Self-pay | Admitting: Cardiology

## 2019-09-20 ENCOUNTER — Ambulatory Visit: Payer: Self-pay | Admitting: Cardiology

## 2019-09-29 ENCOUNTER — Other Ambulatory Visit: Payer: Self-pay | Admitting: Cardiology

## 2019-10-09 ENCOUNTER — Other Ambulatory Visit: Payer: Self-pay | Admitting: Cardiology

## 2019-11-09 ENCOUNTER — Other Ambulatory Visit: Payer: Self-pay

## 2019-11-09 MED ORDER — OMEPRAZOLE 40 MG PO CPDR: 40.0000 mg | DELAYED_RELEASE_CAPSULE | Freq: Every day | ORAL | 3 refills | Status: DC

## 2019-11-16 ENCOUNTER — Ambulatory Visit: Payer: Self-pay | Admitting: Cardiology

## 2019-12-08 ENCOUNTER — Other Ambulatory Visit: Payer: Self-pay | Admitting: Cardiology

## 2019-12-15 ENCOUNTER — Ambulatory Visit: Payer: Self-pay | Admitting: Cardiology

## 2019-12-21 ENCOUNTER — Other Ambulatory Visit: Payer: Self-pay | Admitting: Cardiology

## 2020-01-11 ENCOUNTER — Telehealth: Payer: Self-pay

## 2020-01-11 ENCOUNTER — Other Ambulatory Visit: Payer: Self-pay

## 2020-01-11 MED ORDER — VALSARTAN 320 MG PO TABS: 320.0000 mg | ORAL_TABLET | Freq: Every day | ORAL | 0 refills | Status: DC

## 2020-01-11 NOTE — Telephone Encounter (Signed)
done

## 2020-01-26 ENCOUNTER — Ambulatory Visit: Payer: Self-pay | Admitting: Cardiology

## 2020-02-10 ENCOUNTER — Other Ambulatory Visit: Payer: Self-pay

## 2020-02-10 MED ORDER — VALSARTAN 320 MG PO TABS: 320.0000 mg | ORAL_TABLET | Freq: Every day | ORAL | 1 refills | Status: DC

## 2020-02-23 ENCOUNTER — Other Ambulatory Visit: Payer: Self-pay

## 2020-02-23 ENCOUNTER — Telehealth: Payer: Self-pay | Admitting: Cardiology

## 2020-02-23 MED ORDER — AMLODIPINE BESYLATE 10 MG PO TABS: 10.0000 mg | ORAL_TABLET | Freq: Every day | ORAL | 0 refills | Status: DC

## 2020-02-23 NOTE — Telephone Encounter (Signed)
Done

## 2020-02-23 NOTE — Telephone Encounter (Signed)
Patient wife called and advised patient is out of medication and no refills are at the pharmacy . She advised patient needs a refill of Amlodipine 10 mg sent to [facility] on [address] . If you have any questions please call patient at [phone] . Thank you

## 2020-02-27 ENCOUNTER — Ambulatory Visit: Payer: Self-pay | Admitting: Cardiology

## 2020-03-07 ENCOUNTER — Other Ambulatory Visit: Payer: Self-pay | Admitting: Cardiology

## 2020-04-03 ENCOUNTER — Ambulatory Visit: Payer: Self-pay | Admitting: Cardiology

## 2020-04-06 ENCOUNTER — Other Ambulatory Visit: Payer: Self-pay | Admitting: Cardiology

## 2020-04-17 ENCOUNTER — Other Ambulatory Visit: Payer: Self-pay | Admitting: Cardiology

## 2020-04-17 DIAGNOSIS — M109 Gout, unspecified: Secondary | ICD-10-CM

## 2020-04-24 ENCOUNTER — Other Ambulatory Visit: Payer: Self-pay

## 2020-04-24 MED ORDER — AMLODIPINE BESYLATE 10 MG PO TABS: 10.0000 mg | ORAL_TABLET | Freq: Every day | ORAL | 1 refills | Status: DC

## 2020-04-24 MED ORDER — ATENOLOL 100 MG PO TABS
100.0000 mg | ORAL_TABLET | Freq: Every day | ORAL | 1 refills | Status: DC
Start: 2020-04-24 — End: 2020-11-20

## 2020-05-16 ENCOUNTER — Ambulatory Visit: Payer: Self-pay | Admitting: Cardiology

## 2020-06-02 ENCOUNTER — Other Ambulatory Visit: Payer: Self-pay | Admitting: Cardiology

## 2020-06-21 ENCOUNTER — Ambulatory Visit: Payer: Self-pay | Admitting: Cardiology

## 2020-07-18 ENCOUNTER — Ambulatory Visit: Payer: Self-pay | Admitting: Cardiology

## 2020-08-04 ENCOUNTER — Other Ambulatory Visit: Payer: Self-pay | Admitting: Cardiology

## 2020-08-20 ENCOUNTER — Ambulatory Visit: Payer: Self-pay | Admitting: Cardiology

## 2020-09-26 ENCOUNTER — Ambulatory Visit: Payer: Self-pay | Admitting: Cardiology

## 2020-11-12 ENCOUNTER — Ambulatory Visit: Payer: Self-pay | Admitting: Cardiology

## 2020-11-15 ENCOUNTER — Other Ambulatory Visit: Payer: Self-pay | Admitting: Cardiology

## 2020-11-20 ENCOUNTER — Other Ambulatory Visit: Payer: Self-pay | Admitting: Cardiology

## 2020-12-19 ENCOUNTER — Ambulatory Visit: Payer: Self-pay | Admitting: Cardiology

## 2021-01-04 ENCOUNTER — Telehealth: Payer: Self-pay | Admitting: Cardiology

## 2021-01-04 NOTE — Telephone Encounter (Signed)
Pt's spouse called wanting to know why Pt's prescription keeps getting refilled despite Pt not being seen w/us since in over 3 years. Please call his spouse to discuss.

## 2021-01-31 ENCOUNTER — Ambulatory Visit: Payer: Self-pay | Admitting: Cardiology

## 2021-02-05 ENCOUNTER — Other Ambulatory Visit: Payer: Self-pay | Admitting: Cardiology

## 2021-02-05 DIAGNOSIS — M109 Gout, unspecified: Secondary | ICD-10-CM

## 2021-02-14 ENCOUNTER — Other Ambulatory Visit: Payer: Self-pay | Admitting: Cardiology

## 2021-02-26 ENCOUNTER — Ambulatory Visit: Payer: Self-pay | Admitting: Cardiology

## 2021-04-23 ENCOUNTER — Ambulatory Visit: Payer: Self-pay | Admitting: Cardiology

## 2021-05-06 ENCOUNTER — Other Ambulatory Visit: Payer: Self-pay | Admitting: Cardiology

## 2021-05-15 ENCOUNTER — Other Ambulatory Visit: Payer: Self-pay | Admitting: Cardiology

## 2021-05-29 ENCOUNTER — Ambulatory Visit: Payer: Self-pay | Admitting: Cardiology

## 2021-06-13 ENCOUNTER — Other Ambulatory Visit: Payer: Self-pay | Admitting: Cardiology

## 2021-06-28 ENCOUNTER — Ambulatory Visit: Payer: Self-pay | Admitting: Cardiology

## 2021-07-25 ENCOUNTER — Ambulatory Visit: Payer: Self-pay | Admitting: Cardiology

## 2021-08-26 ENCOUNTER — Ambulatory Visit: Payer: Self-pay | Admitting: Cardiology

## 2021-10-19 ENCOUNTER — Other Ambulatory Visit: Payer: Self-pay | Admitting: Cardiology

## 2021-10-23 ENCOUNTER — Other Ambulatory Visit: Payer: Self-pay | Admitting: Cardiology

## 2021-10-23 ENCOUNTER — Telehealth: Payer: Self-pay

## 2021-10-24 ENCOUNTER — Other Ambulatory Visit: Payer: Self-pay | Admitting: Cardiology

## 2021-10-26 ENCOUNTER — Other Ambulatory Visit: Payer: Self-pay | Admitting: Cardiology

## 2021-11-09 ENCOUNTER — Other Ambulatory Visit: Payer: Self-pay | Admitting: Cardiology

## 2021-11-19 ENCOUNTER — Ambulatory Visit: Payer: Self-pay | Admitting: Cardiology

## 2022-01-07 ENCOUNTER — Other Ambulatory Visit: Payer: Self-pay

## 2022-01-07 ENCOUNTER — Telehealth: Payer: Self-pay | Admitting: Cardiology

## 2022-01-07 MED ORDER — ATENOLOL 100 MG PO TABS: 100.0000 mg | ORAL_TABLET | Freq: Every day | ORAL | 3 refills | Status: AC

## 2022-01-07 NOTE — Telephone Encounter (Signed)
Patient needs refill for atenolol. Patient is out of medication.

## 2022-01-07 NOTE — Telephone Encounter (Signed)
Ok it has been send

## 2022-01-30 ENCOUNTER — Ambulatory Visit: Payer: Self-pay | Admitting: Cardiology

## 2022-02-18 ENCOUNTER — Ambulatory Visit: Payer: Self-pay | Admitting: Cardiology

## 2022-02-20 ENCOUNTER — Other Ambulatory Visit: Payer: Self-pay | Admitting: Cardiology

## 2022-02-20 DIAGNOSIS — M109 Gout, unspecified: Secondary | ICD-10-CM

## 2022-03-17 ENCOUNTER — Ambulatory Visit: Payer: Self-pay | Admitting: Cardiology

## 2022-03-31 NOTE — Telephone Encounter (Signed)
Error

## 2022-04-24 ENCOUNTER — Other Ambulatory Visit: Payer: Self-pay

## 2022-05-01 ENCOUNTER — Encounter: Payer: Self-pay | Admitting: Cardiology

## 2022-05-13 ENCOUNTER — Other Ambulatory Visit: Payer: Self-pay | Admitting: Cardiology

## 2023-01-28 NOTE — Telephone Encounter (Signed)
This encounter was created in error - please disregard.
